# Patient Record
Sex: Male | Born: 1990 | Race: Black or African American | Hispanic: No | Marital: Single | Smoking: Never smoker
Health system: Southern US, Community
[De-identification: ages and names within clinical notes are randomized; demographics above are authoritative.]

## PROBLEM LIST (undated history)

## (undated) DIAGNOSIS — G4730 Sleep apnea, unspecified: Secondary | ICD-10-CM

## (undated) HISTORY — DX: Sleep apnea, unspecified: G47.30

---

## 2014-06-10 ENCOUNTER — Emergency Department (HOSPITAL_COMMUNITY)
Admission: EM | Admit: 2014-06-10 | Discharge: 2014-06-10 | Disposition: A | Discharge status: Home / Self Care | Payer: Medicaid Other | Attending: Emergency Medicine | Admitting: Emergency Medicine

## 2014-06-10 ENCOUNTER — Emergency Department (HOSPITAL_COMMUNITY): Payer: Medicaid Other

## 2014-06-10 ENCOUNTER — Encounter (HOSPITAL_COMMUNITY): Payer: Self-pay | Admitting: Emergency Medicine

## 2014-06-10 DIAGNOSIS — R42 Dizziness and giddiness: Secondary | ICD-10-CM | POA: Diagnosis not present

## 2014-06-10 DIAGNOSIS — R0602 Shortness of breath: Secondary | ICD-10-CM | POA: Diagnosis present

## 2014-06-10 DIAGNOSIS — R05 Cough: Secondary | ICD-10-CM | POA: Insufficient documentation

## 2014-06-10 DIAGNOSIS — R002 Palpitations: Secondary | ICD-10-CM | POA: Insufficient documentation

## 2014-06-10 LAB — CBC WITH DIFFERENTIAL/PLATELET
Basophils Absolute: 0 10*3/uL (ref 0.0–0.1)
Basophils Relative: 0 % (ref 0–1)
Eosinophils Absolute: 0 10*3/uL (ref 0.0–0.7)
Eosinophils Relative: 1 % (ref 0–5)
HEMATOCRIT: 40.6 % (ref 39.0–52.0)
Hemoglobin: 13.2 g/dL (ref 13.0–17.0)
LYMPHS ABS: 1.8 10*3/uL (ref 0.7–4.0)
LYMPHS PCT: 24 % (ref 12–46)
MCH: 27.4 pg (ref 26.0–34.0)
MCHC: 32.5 g/dL (ref 30.0–36.0)
MCV: 84.4 fL (ref 78.0–100.0)
Monocytes Absolute: 0.5 10*3/uL (ref 0.1–1.0)
Monocytes Relative: 7 % (ref 3–12)
NEUTROS ABS: 5.3 10*3/uL (ref 1.7–7.7)
Neutrophils Relative %: 68 % (ref 43–77)
PLATELETS: 236 10*3/uL (ref 150–400)
RBC: 4.81 MIL/uL (ref 4.22–5.81)
RDW: 13 % (ref 11.5–15.5)
WBC: 7.7 10*3/uL (ref 4.0–10.5)

## 2014-06-10 LAB — BASIC METABOLIC PANEL
ANION GAP: 13 (ref 5–15)
BUN: 10 mg/dL (ref 6–23)
CALCIUM: 9.4 mg/dL (ref 8.4–10.5)
CO2: 24 meq/L (ref 19–32)
Chloride: 102 mEq/L (ref 96–112)
Creatinine, Ser: 0.9 mg/dL (ref 0.50–1.35)
GFR calc Af Amer: >90 mL/min (ref >90)
GFR calc non Af Amer: >90 mL/min (ref >90)
Glucose, Bld: 90 mg/dL (ref 70–99)
Potassium: 4.3 mEq/L (ref 3.7–5.3)
SODIUM: 139 meq/L (ref 137–147)

## 2014-06-10 LAB — URINALYSIS, ROUTINE W REFLEX MICROSCOPIC
Bilirubin Urine: NEGATIVE
Glucose, UA: NEGATIVE mg/dL
Hgb urine dipstick: NEGATIVE
Ketones, ur: NEGATIVE mg/dL
LEUKOCYTES UA: NEGATIVE
NITRITE: NEGATIVE
PH: 6 (ref 5.0–8.0)
Protein, ur: NEGATIVE mg/dL
SPECIFIC GRAVITY, URINE: 1.025 (ref 1.005–1.030)
Urobilinogen, UA: 1 mg/dL (ref 0.0–1.0)

## 2014-06-10 MED ORDER — SODIUM CHLORIDE 0.9 % IV BOLUS (SEPSIS)
1000.0000 mL | Freq: Once | INTRAVENOUS | Status: AC
  Administered 2014-06-10: 1000 mL via INTRAVENOUS

## 2014-06-10 NOTE — ED Notes (Signed)
While taking the standing orthostatic blood pressure, pt's heart rate dropped to 38, and immediately increased to 68 beats per minute. Upstill, PA notified.

## 2014-06-10 NOTE — ED Notes (Signed)
Patient transported to X-ray 

## 2014-06-10 NOTE — ED Notes (Signed)
Pt c/o URI sx with cough and some SOB and palpitations

## 2014-06-10 NOTE — ED Provider Notes (Signed)
CSN: [id number]     Arrival date & time 06/10/14  1720 History   First MD Initiated Contact with Patient 06/10/14 1911     Chief Complaint  Patient presents with  . Cough  . Shortness of Breath  . Palpitations     (Consider location/radiation/quality/duration/timing/severity/associated sxs/prior Treatment) Patient is a 23 y.o. male presenting with shortness of breath and palpitations. The history is provided by the patient and the spouse. No language interpreter was used.  Shortness of Breath Associated symptoms: cough   Associated symptoms: no fever, no headaches and no rash   Associated symptoms comment:  He reports symptoms for the past 4 days of positional lightheadedness, "feel like I'm going to pass out", without syncope, associated with shortness of breath, heart palpitations and a mild cough. He has felt tired. He denies fever but has night sweats and nausea. No other illness in the house.  Palpitations Associated symptoms: cough and shortness of breath     History reviewed. No pertinent past medical history. History reviewed. No pertinent past surgical history. History reviewed. No pertinent family history. History  Substance Use Topics  . Smoking status: Never Smoker   . Smokeless tobacco: Not on file  . Alcohol Use: No    Review of Systems  Constitutional: Negative for fever and chills.  HENT: Negative.   Eyes: Negative.  Negative for photophobia and visual disturbance.  Respiratory: Positive for cough and shortness of breath.   Cardiovascular: Positive for palpitations.  Gastrointestinal: Negative.   Musculoskeletal: Negative.  Negative for myalgias.  Skin: Negative.  Negative for rash.  Neurological: Positive for light-headedness. Negative for syncope and headaches.      Allergies  Review of patient's allergies indicates no known allergies.  Home Medications   Prior to Admission medications   Medication Sig Start Date End Date Taking? Authorizing  Provider  acetaminophen (TYLENOL) 325 MG tablet Take 650 mg by mouth every 6 (six) hours as needed for mild pain or fever.   Yes Historical Provider, MD   BP 122/69 mmHg  Pulse 62  Temp(Src) 98.7 F (37.1 C) (Oral)  Resp 23  SpO2 100% Physical Exam  Constitutional: He is oriented to person, place, and time. He appears well-developed and well-nourished.  HENT:  Head: Normocephalic.  Eyes: Conjunctivae are normal.  Neck: Normal range of motion. Neck supple.  Cardiovascular: Normal rate and regular rhythm.   Pulmonary/Chest: Effort normal and breath sounds normal.  Abdominal: Soft. Bowel sounds are normal. There is no tenderness. There is no rebound and no guarding.  Musculoskeletal: Normal range of motion.  Neurological: He is alert and oriented to person, place, and time. Coordination normal.  Skin: Skin is warm and dry. No rash noted.  Psychiatric: He has a normal mood and affect.    ED Course  Procedures (including critical care time) Labs Review Labs Reviewed  CBC WITH DIFFERENTIAL  BASIC METABOLIC PANEL  URINALYSIS, ROUTINE W REFLEX MICROSCOPIC   Results for orders placed or performed during the hospital encounter of 06/10/14  Basic metabolic panel  Result Value Ref Range   Sodium 139 137 - 147 mEq/L   Potassium 4.3 3.7 - 5.3 mEq/L   Chloride 102 96 - 112 mEq/L   CO2 24 19 - 32 mEq/L   Glucose, Bld 90 70 - 99 mg/dL   BUN 10 6 - 23 mg/dL   Creatinine, Ser 4.090.90 0.50 - 1.35 mg/dL   Calcium 9.4 8.4 - 81.110.5 mg/dL   GFR calc non Af Amer >  90 >90 mL/min   GFR calc Af Amer >90 >90 mL/min   Anion gap 13 5 - 15  CBC with Differential  Result Value Ref Range   WBC 7.7 4.0 - 10.5 K/uL   RBC 4.81 4.22 - 5.81 MIL/uL   Hemoglobin 13.2 13.0 - 17.0 g/dL   HCT 16.140.6 09.639.0 - 04.552.0 %   MCV 84.4 78.0 - 100.0 fL   MCH 27.4 26.0 - 34.0 pg   MCHC 32.5 30.0 - 36.0 g/dL   RDW 40.913.0 81.111.5 - 91.415.5 %   Platelets 236 150 - 400 K/uL   Neutrophils Relative % 68 43 - 77 %   Neutro Abs 5.3 1.7 -  7.7 K/uL   Lymphocytes Relative 24 12 - 46 %   Lymphs Abs 1.8 0.7 - 4.0 K/uL   Monocytes Relative 7 3 - 12 %   Monocytes Absolute 0.5 0.1 - 1.0 K/uL   Eosinophils Relative 1 0 - 5 %   Eosinophils Absolute 0.0 0.0 - 0.7 K/uL   Basophils Relative 0 0 - 1 %   Basophils Absolute 0.0 0.0 - 0.1 K/uL  Urinalysis, Routine w reflex microscopic  Result Value Ref Range   Color, Urine YELLOW YELLOW   APPearance HAZY (A) CLEAR   Specific Gravity, Urine 1.025 1.005 - 1.030   pH 6.0 5.0 - 8.0   Glucose, UA NEGATIVE NEGATIVE mg/dL   Hgb urine dipstick NEGATIVE NEGATIVE   Bilirubin Urine NEGATIVE NEGATIVE   Ketones, ur NEGATIVE NEGATIVE mg/dL   Protein, ur NEGATIVE NEGATIVE mg/dL   Urobilinogen, UA 1.0 0.0 - 1.0 mg/dL   Nitrite NEGATIVE NEGATIVE   Leukocytes, UA NEGATIVE NEGATIVE   Dg Chest 2 View  06/10/2014   CLINICAL DATA:  Shortness of breath.  EXAM: CHEST  2 VIEW  COMPARISON:  None.  FINDINGS: The heart size and mediastinal contours are within normal limits. Both lungs are clear. No pneumothorax or pleural effusion is noted. The visualized skeletal structures are unremarkable.  IMPRESSION: No acute cardiopulmonary abnormality seen.   Electronically Signed   By: Roque LiasJames  Green M.D.   On: 06/10/2014 19:37    Imaging Review Dg Chest 2 View  06/10/2014   CLINICAL DATA:  Shortness of breath.  EXAM: CHEST  2 VIEW  COMPARISON:  None.  FINDINGS: The heart size and mediastinal contours are within normal limits. Both lungs are clear. No pneumothorax or pleural effusion is noted. The visualized skeletal structures are unremarkable.  IMPRESSION: No acute cardiopulmonary abnormality seen.   Electronically Signed   By: Roque LiasJames  Green M.D.   On: 06/10/2014 19:37     EKG Interpretation None      MDM   Final diagnoses:  SOB (shortness of breath)    1. Anxiety  Lab studies are normal. No orthostatic hypotension. He is feeling some better after IV fluids and appears better.   Symptomatic episodes last  approximately 10 minutes. He is under significant stress. Feel it is likely that symptoms are a result of anxiety and panic attacks. Discussed treatment and outpatient follow up.     Arnoldo HookerShari A Mistee Soliman, PA-C 06/11/14 0053  Layla MawKristen N Ward, DO 06/11/14 [id number]

## 2014-06-10 NOTE — ED Notes (Signed)
Upstill, PA at bedside.  

## 2014-06-10 NOTE — Discharge Instructions (Signed)

## 2015-01-16 ENCOUNTER — Encounter (HOSPITAL_COMMUNITY): Payer: Self-pay | Admitting: *Deleted

## 2015-01-16 ENCOUNTER — Emergency Department (HOSPITAL_COMMUNITY)
Admission: EM | Admit: 2015-01-16 | Discharge: 2015-01-16 | Disposition: A | Discharge status: Home / Self Care | Payer: Medicaid Other | Attending: Emergency Medicine | Admitting: Emergency Medicine

## 2015-01-16 DIAGNOSIS — W450XXA Nail entering through skin, initial encounter: Secondary | ICD-10-CM | POA: Insufficient documentation

## 2015-01-16 DIAGNOSIS — Y998 Other external cause status: Secondary | ICD-10-CM | POA: Diagnosis not present

## 2015-01-16 DIAGNOSIS — Y9389 Activity, other specified: Secondary | ICD-10-CM | POA: Diagnosis not present

## 2015-01-16 DIAGNOSIS — Z23 Encounter for immunization: Secondary | ICD-10-CM | POA: Insufficient documentation

## 2015-01-16 DIAGNOSIS — Y9289 Other specified places as the place of occurrence of the external cause: Secondary | ICD-10-CM | POA: Diagnosis not present

## 2015-01-16 DIAGNOSIS — T148XXA Other injury of unspecified body region, initial encounter: Secondary | ICD-10-CM

## 2015-01-16 DIAGNOSIS — S91332A Puncture wound without foreign body, left foot, initial encounter: Secondary | ICD-10-CM | POA: Diagnosis not present

## 2015-01-16 MED ORDER — TETANUS-DIPHTH-ACELL PERTUSSIS 5-2.5-18.5 LF-MCG/0.5 IM SUSP
0.5000 mL | Freq: Once | INTRAMUSCULAR | Status: AC
  Administered 2015-01-16: 0.5 mL via INTRAMUSCULAR
  Filled 2015-01-16: qty 0.5

## 2015-01-16 NOTE — ED Notes (Signed)
Pt reports he stepped on a nail on SAT. Pt reports he needs a tetanus shot.

## 2015-01-16 NOTE — ED Provider Notes (Signed)
CSN: [id number]     Arrival date & time 01/16/15  1756 History  This chart was scribed for Roxy Horsemanobert Michaelann Gunnoe, PA-C, working with Lorre NickAnthony Allen, MD by Elon SpannerGarrett Cook, ED Scribe. This patient was seen in room TR07C/TR07C and the patient's care was started at 6:10 PM.   Chief Complaint  Patient presents with  . Foot Injury   The history is provided by the patient. No language interpreter was used.   HPI Comments: Adrian Thomas is a 24 y.o. male who presents to the Emergency Department complaining of a slight puncture on the bottom of the left foot caused by a nail onset several days ago.  Patient reports the nail penetrated a very small amount and the entirety of the nail was visualized after the incident. He denies sensation of foreign body. Tetanus status unknown.   History reviewed. No pertinent past medical history. History reviewed. No pertinent past surgical history. History reviewed. No pertinent family history. History  Substance Use Topics  . Smoking status: Never Smoker   . Smokeless tobacco: Not on file  . Alcohol Use: No    Review of Systems  Constitutional: Negative for fever.  Skin: Positive for wound.      Allergies  Review of patient's allergies indicates no known allergies.  Home Medications   Prior to Admission medications   Medication Sig Start Date End Date Taking? Authorizing Provider  acetaminophen (TYLENOL) 325 MG tablet Take 650 mg by mouth every 6 (six) hours as needed for mild pain or fever.    Historical Provider, MD   There were no vitals taken for this visit. Physical Exam  Constitutional: He is oriented to person, place, and time. He appears well-developed and well-nourished. No distress.  HENT:  Head: Normocephalic and atraumatic.  Eyes: Conjunctivae and EOM are normal.  Neck: Neck supple. No tracheal deviation present.  Cardiovascular: Normal rate.   Pulmonary/Chest: Effort normal. No respiratory distress.  Musculoskeletal: Normal range of motion.   Neurological: He is alert and oriented to person, place, and time.  Skin: Skin is warm and dry.  Extremely shallow puncture of skin of right foot, not through all layers of skin, no retained foreign body, no sign of infection  Psychiatric: He has a normal mood and affect. His behavior is normal.  Nursing note and vitals reviewed.   ED Course  Procedures (including critical care time)  DIAGNOSTIC STUDIES: Oxygen Saturation is 97% on RA, normal by my interpretation.    COORDINATION OF CARE:  6:12 PM Will order Tdap.  Patient advised of return precautions.  Patient acknowledges and agrees with plan.    Labs Review Labs Reviewed - No data to display  Imaging Review No results found.   EKG Interpretation None      MDM   Final diagnoses:  Puncture wound    Patient stepped on a nail.  The nail does not appear to have broken all the layers of skin.  There is only a small very superficial nick of the skin.  There is no ttp.  No discharge, erythema, or signs of infection.  At this time, because of the extremely shallow nature of the wound, and the fact that it has nearly healed I will hold abx.  Patient was given clear precautions and signs to watch out for developing infection, in which case he should return to be placed on abx.    Tdap updated.  I personally performed the services described in this documentation, which was scribed in my presence.  The recorded information has been reviewed and is accurate.      Roxy Horseman, PA-C 01/16/15 1819  Lorre Nick, MD 01/16/15 [phone]

## 2015-01-16 NOTE — Discharge Instructions (Signed)
Puncture Wound °A puncture wound is an injury that extends through all layers of the skin and into the tissue beneath the skin (subcutaneous tissue). Puncture wounds become infected easily because germs often enter the body and go beneath the skin during the injury. Having a deep wound with a small entrance point makes it difficult for your caregiver to adequately clean the wound. This is especially true if you have stepped on a nail and it has passed through a dirty shoe or other situations where the wound is obviously contaminated. °CAUSES  °Many puncture wounds involve glass, nails, splinters, fish hooks, or other objects that enter the skin (foreign bodies). A puncture wound may also be caused by a human bite or animal bite. °DIAGNOSIS  °A puncture wound is usually diagnosed by your history and a physical exam. You may need to have an X-ray or an ultrasound to check for any foreign bodies still in the wound. °TREATMENT  °· Your caregiver will clean the wound as thoroughly as possible. Depending on the location of the wound, a bandage (dressing) may be applied. °· Your caregiver might prescribe antibiotic medicines. °· You may need a follow-up visit to check on your wound. Follow all instructions as directed by your caregiver. °HOME CARE INSTRUCTIONS  °· Change your dressing once per day, or as directed by your caregiver. If the dressing sticks, it may be removed by soaking the area in water. °· If your caregiver has given you follow-up instructions, it is very important that you return for a follow-up appointment. Not following up as directed could result in a chronic or permanent injury, pain, and disability. °· Only take over-the-counter or prescription medicines for pain, discomfort, or fever as directed by your caregiver. °· If you are given antibiotics, take them as directed. Finish them even if you start to feel better. °You may need a tetanus shot if: °· You cannot remember when you had your last tetanus  shot. °· You have never had a tetanus shot. °If you got a tetanus shot, your arm may swell, get red, and feel warm to the touch. This is common and not a problem. If you need a tetanus shot and you choose not to have one, there is a rare chance of getting tetanus. Sickness from tetanus can be serious. °You may need a rabies shot if an animal bite caused your puncture wound. °SEEK MEDICAL CARE IF:  °· You have redness, swelling, or increasing pain in the wound. °· You have red streaks going away from the wound. °· You notice a bad smell coming from the wound or dressing. °· You have yellowish-white fluid (pus) coming from the wound. °· You are treated with an antibiotic for infection, but the infection is not getting better. °· You notice something in the wound, such as rubber from your shoe, cloth, or another object. °· You have a fever. °· You have severe pain. °· You have difficulty breathing. °· You feel dizzy or faint. °· You cannot stop vomiting. °· You lose feeling, develop numbness, or cannot move a limb below the wound. °· Your symptoms worsen. °MAKE SURE YOU: °· Understand these instructions. °· Will watch your condition. °· Will get help right away if you are not doing well or get worse. °Document Released: 04/04/2005 Document Revised: 09/17/2011 Document Reviewed: 12/12/2010 °[employer]® Patient Information ©2015 [employer]. This information is not intended to replace advice given to you by your health care provider. Make sure you discuss any questions you   have with your health care provider. ° °

## 2015-01-16 NOTE — ED Notes (Signed)
Declined W/C at D/C and was escorted to lobby by RN. 

## 2020-02-22 ENCOUNTER — Other Ambulatory Visit: Payer: Self-pay

## 2020-02-22 ENCOUNTER — Encounter (HOSPITAL_COMMUNITY): Payer: Self-pay

## 2020-02-22 ENCOUNTER — Ambulatory Visit (HOSPITAL_COMMUNITY)
Admission: EM | Admit: 2020-02-22 | Discharge: 2020-02-22 | Disposition: A | Discharge status: Home / Self Care | Payer: HRSA Program | Attending: Family Medicine | Admitting: Family Medicine

## 2020-02-22 DIAGNOSIS — J40 Bronchitis, not specified as acute or chronic: Secondary | ICD-10-CM | POA: Diagnosis present

## 2020-02-22 DIAGNOSIS — R519 Headache, unspecified: Secondary | ICD-10-CM

## 2020-02-22 DIAGNOSIS — Z20822 Contact with and (suspected) exposure to covid-19: Secondary | ICD-10-CM | POA: Diagnosis present

## 2020-02-22 DIAGNOSIS — U071 COVID-19: Secondary | ICD-10-CM | POA: Diagnosis not present

## 2020-02-22 DIAGNOSIS — J029 Acute pharyngitis, unspecified: Secondary | ICD-10-CM

## 2020-02-22 DIAGNOSIS — R509 Fever, unspecified: Secondary | ICD-10-CM | POA: Diagnosis present

## 2020-02-22 DIAGNOSIS — R000 Tachycardia, unspecified: Secondary | ICD-10-CM | POA: Insufficient documentation

## 2020-02-22 DIAGNOSIS — R0981 Nasal congestion: Secondary | ICD-10-CM | POA: Insufficient documentation

## 2020-02-22 DIAGNOSIS — R5383 Other fatigue: Secondary | ICD-10-CM | POA: Diagnosis present

## 2020-02-22 DIAGNOSIS — Z1152 Encounter for screening for COVID-19: Secondary | ICD-10-CM

## 2020-02-22 DIAGNOSIS — R05 Cough: Secondary | ICD-10-CM

## 2020-02-22 LAB — POCT RAPID STREP A, ED / UC: Streptococcus, Group A Screen (Direct): NEGATIVE

## 2020-02-22 LAB — SARS CORONAVIRUS 2 (TAT 6-24 HRS): SARS Coronavirus 2: POSITIVE — AB

## 2020-02-22 MED ORDER — DEXAMETHASONE SODIUM PHOSPHATE 10 MG/ML IJ SOLN
INTRAMUSCULAR | Status: AC
  Filled 2020-02-22: qty 1

## 2020-02-22 MED ORDER — ACETAMINOPHEN 325 MG PO TABS
ORAL_TABLET | ORAL | Status: AC
  Filled 2020-02-22: qty 2

## 2020-02-22 MED ORDER — FLUTICASONE-SALMETEROL 100-50 MCG/DOSE IN AEPB: 1.0000 | INHALATION_SPRAY | Freq: Two times a day (BID) | RESPIRATORY_TRACT | 0 refills | Status: DC

## 2020-02-22 MED ORDER — ALBUTEROL SULFATE HFA 108 (90 BASE) MCG/ACT IN AERS: 2.0000 | INHALATION_SPRAY | RESPIRATORY_TRACT | 0 refills | Status: DC | PRN

## 2020-02-22 MED ORDER — DEXAMETHASONE SODIUM PHOSPHATE 10 MG/ML IJ SOLN
10.0000 mg | Freq: Once | INTRAMUSCULAR | Status: AC
  Administered 2020-02-22: 10 mg via INTRAMUSCULAR

## 2020-02-22 MED ORDER — PREDNISONE 10 MG (21) PO TBPK: ORAL_TABLET | Freq: Every day | ORAL | 0 refills | Status: DC

## 2020-02-22 MED ORDER — BENZONATATE 100 MG PO CAPS: 100.0000 mg | ORAL_CAPSULE | Freq: Three times a day (TID) | ORAL | 0 refills | Status: DC

## 2020-02-22 MED ORDER — ACETAMINOPHEN 325 MG PO TABS
650.0000 mg | ORAL_TABLET | Freq: Once | ORAL | Status: AC
  Administered 2020-02-22: 650 mg via ORAL

## 2020-02-22 NOTE — ED Triage Notes (Signed)
Pt presents for COVID testing after exposure. Reports headache, nasal congestion, cough and sore throat x 2 days.

## 2020-02-22 NOTE — Discharge Instructions (Signed)
I have sent in a steroid taper for you to take as well. Take 6 tablets for the first two days, take 5 tablets for day three and four, take 4 tablets for days five and six, take 3 tablets for days seven and eight, take 2 tablets for day nine and ten, then take 1 tablet for days eleven and twelve.  I have sent in an albuterol inhaler for you to use every 4 hours as needed for shortness of breath, cough  I have sent in a steroid inhaler for you to use twice a day to help keep your lungs open  I have sent in tessalon perles for cough as well  Your COVID test is pending.  You should self quarantine until the test result is back.    Take Tylenol as needed for fever or discomfort.  Rest and keep yourself hydrated.    Go to the emergency department if you develop acute worsening symptoms.

## 2020-02-22 NOTE — ED Provider Notes (Signed)
[facility]   [id number] 02/22/20 Arrival Time: 1012   CC: COVID symptoms  SUBJECTIVE: History from: patient.  Adrian Thomas is a 29 y.o. male who presents with abrupt onset of nasal congestion, PND, fever, fatigue, SOB and persistent dry cough for the last week. Reports sick exposure to positive COVID. Denies recent travel. Has negative history of Covid. Has not completed Covid vaccines. Has not taken OTC medications for this. There are no aggravating or alleviating factors. Denies previous symptoms in the past. Denies sinus pain, rhinorrhea, sore throat,  wheezing, chest pain, nausea, changes in bowel or bladder habits.    ROS: As per HPI.  All other pertinent ROS negative.     History reviewed. No pertinent past medical history. History reviewed. No pertinent surgical history. No Known Allergies No current facility-administered medications on file prior to encounter.   Current Outpatient Medications on File Prior to Encounter  Medication Sig Dispense Refill  . acetaminophen (TYLENOL) 325 MG tablet Take 650 mg by mouth every 6 (six) hours as needed for mild pain or fever.     Social History   Socioeconomic History  . Marital status: Single    Spouse name: Not on file  . Number of children: Not on file  . Years of education: Not on file  . Highest education level: Not on file  Occupational History  . Not on file  Tobacco Use  . Smoking status: Never Smoker  . Smokeless tobacco: Never Used  Substance and Sexual Activity  . Alcohol use: No  . Drug use: No  . Sexual activity: Not on file  Other Topics Concern  . Not on file  Social History Narrative  . Not on file   Social Determinants of Health   Financial Resource Strain:   . Difficulty of Paying Living Expenses:   Food Insecurity:   . Worried About Programme researcher, broadcasting/film/video in the Last Year:   . Barista in the Last Year:   Transportation Needs:   . Freight forwarder (Medical):   Marland Kitchen Lack of  Transportation (Non-Medical):   Physical Activity:   . Days of Exercise per Week:   . Minutes of Exercise per Session:   Stress:   . Feeling of Stress :   Social Connections:   . Frequency of Communication with Friends and Family:   . Frequency of Social Gatherings with Friends and Family:   . Attends Religious Services:   . Active Member of Clubs or Organizations:   . Attends Banker Meetings:   Marland Kitchen Marital Status:   Intimate Partner Violence:   . Fear of Current or Ex-Partner:   . Emotionally Abused:   Marland Kitchen Physically Abused:   . Sexually Abused:    History reviewed. No pertinent family history.  OBJECTIVE:  Vitals:   02/22/20 1232  BP: 121/73  Pulse: (!) 111  Resp: 18  Temp: (!) 101.4 F (38.6 C)  TempSrc: Oral  SpO2: 98%     General appearance: alert; appears fatigued, but nontoxic; speaking in full sentences and tolerating own secretions HEENT: NCAT; Ears: EACs clear, TMs pearly gray; Eyes: PERRL.  EOM grossly intact. Sinuses: nontender; Nose: nares patent without rhinorrhea, Throat: oropharynx clear, tonsils non erythematous or enlarged, uvula midline  Neck: supple without LAD Lungs: unlabored respirations, symmetrical air entry; cough: moderate; no respiratory distress; diffuse wheezing throughout bilateral lung fields Heart: regular rate and rhythm.  Radial pulses 2+ symmetrical bilaterally Skin: warm and dry Psychological: alert  and cooperative; normal mood and affect  LABS:  Results for orders placed or performed during the hospital encounter of 02/22/20 (from the past 24 hour(s))  POCT Rapid Strep A (ED/UC)     Status: None   Collection Time: 02/22/20 12:50 PM  Result Value Ref Range   Streptococcus, Group A Screen (Direct) NEGATIVE NEGATIVE     ASSESSMENT & PLAN:  1. Bronchitis   2. Nasal congestion   3. Other fatigue   4. Close exposure to COVID-19 virus   5. Encounter for screening for COVID-19   6. Fever, unspecified fever cause   7.  Tachycardia     Meds ordered this encounter  Medications  . acetaminophen (TYLENOL) tablet 650 mg  . dexamethasone (DECADRON) injection 10 mg  . predniSONE (STERAPRED UNI-PAK 21 TAB) 10 MG (21) TBPK tablet    Sig: Take by mouth daily. Take 6 tabs by mouth daily  for 2 days, then 5 tabs for 2 days, then 4 tabs for 2 days, then 3 tabs for 2 days, 2 tabs for 2 days, then 1 tab by mouth daily for 2 days    Dispense:  42 tablet    Refill:  0    Order Specific Question:   Supervising Provider    Answer:   Merrilee Jansky [id number]  . albuterol (VENTOLIN HFA) 108 (90 Base) MCG/ACT inhaler    Sig: Inhale 2 puffs into the lungs every 4 (four) hours as needed for wheezing or shortness of breath.    Dispense:  18 g    Refill:  0    Order Specific Question:   Supervising Provider    Answer:   Merrilee Jansky [id number]  . Fluticasone-Salmeterol (ADVAIR DISKUS) 100-50 MCG/DOSE AEPB    Sig: Inhale 1 puff into the lungs 2 (two) times daily.    Dispense:  60 each    Refill:  0    Order Specific Question:   Supervising Provider    Answer:   Merrilee Jansky [id number]  . benzonatate (TESSALON) 100 MG capsule    Sig: Take 1 capsule (100 mg total) by mouth every 8 (eight) hours.    Dispense:  21 capsule    Refill:  0    Order Specific Question:   Supervising Provider    Answer:   Merrilee Jansky [id number]    Decadron 10mg  IM in office today Tessalon Perles for cough Advair prescribed Albuterol inhaler prescribed Prednisone taper prescribed  COVID testing ordered.  It will take between 1-2 days for test results.  Someone will contact you regarding abnormal results.    Patient should remain in quarantine until they have received Covid results.  If negative you may resume normal activities (go back to work/school) while practicing hand hygiene, social distance, and mask wearing.  If positive, patient should remain in quarantine for 10 days from symptom onset AND greater than 72 hours after  symptoms resolution (absence of fever without the use of fever-reducing medication and improvement in respiratory symptoms), whichever is longer Get plenty of rest and push fluids Use OTC zyrtec for nasal congestion, runny nose, and/or sore throat Use OTC flonase for nasal congestion and runny nose Use medications daily for symptom relief Use OTC medications like ibuprofen or tylenol as needed fever or pain Call or go to the ED if you have any new or worsening symptoms such as fever, worsening cough, shortness of breath, chest tightness, chest pain, turning blue, changes in mental status.  Reviewed expectations  re: course of current medical issues. Questions answered. Outlined signs and symptoms indicating need for more acute intervention. Patient verbalized understanding. After Visit Summary given.         Moshe Cipro, NP 02/22/20 1304

## 2020-02-25 LAB — CULTURE, GROUP A STREP (THRC)

## 2020-02-26 ENCOUNTER — Other Ambulatory Visit: Payer: Self-pay

## 2020-02-26 ENCOUNTER — Emergency Department (HOSPITAL_COMMUNITY): Payer: HRSA Program

## 2020-02-26 ENCOUNTER — Inpatient Hospital Stay (HOSPITAL_COMMUNITY)
Admission: EM | Admit: 2020-02-26 | Discharge: 2020-03-03 | DRG: 177 | Disposition: A | Discharge status: Home / Self Care | Payer: HRSA Program | Attending: Family Medicine | Admitting: Family Medicine

## 2020-02-26 DIAGNOSIS — J9601 Acute respiratory failure with hypoxia: Secondary | ICD-10-CM | POA: Diagnosis present

## 2020-02-26 DIAGNOSIS — R404 Transient alteration of awareness: Secondary | ICD-10-CM | POA: Diagnosis not present

## 2020-02-26 DIAGNOSIS — U071 COVID-19: Secondary | ICD-10-CM | POA: Diagnosis not present

## 2020-02-26 DIAGNOSIS — R7401 Elevation of levels of liver transaminase levels: Secondary | ICD-10-CM | POA: Diagnosis present

## 2020-02-26 DIAGNOSIS — E860 Dehydration: Secondary | ICD-10-CM | POA: Diagnosis present

## 2020-02-26 DIAGNOSIS — R0902 Hypoxemia: Secondary | ICD-10-CM

## 2020-02-26 DIAGNOSIS — E871 Hypo-osmolality and hyponatremia: Secondary | ICD-10-CM | POA: Diagnosis present

## 2020-02-26 DIAGNOSIS — R569 Unspecified convulsions: Secondary | ICD-10-CM | POA: Diagnosis present

## 2020-02-26 DIAGNOSIS — J1282 Pneumonia due to coronavirus disease 2019: Secondary | ICD-10-CM | POA: Diagnosis present

## 2020-02-26 DIAGNOSIS — J9600 Acute respiratory failure, unspecified whether with hypoxia or hypercapnia: Secondary | ICD-10-CM | POA: Diagnosis present

## 2020-02-26 DIAGNOSIS — Z9981 Dependence on supplemental oxygen: Secondary | ICD-10-CM

## 2020-02-26 NOTE — ED Triage Notes (Addendum)
Pt was diagnosed with covid on the 16th. He has been having increased sob on exertion. Pt's sats were in the 80's upon arrival. Pt has SOB, Coughing, hot flashes and no appetite was placed on 2L

## 2020-02-27 ENCOUNTER — Emergency Department (HOSPITAL_COMMUNITY): Payer: HRSA Program

## 2020-02-27 ENCOUNTER — Inpatient Hospital Stay (HOSPITAL_COMMUNITY): Payer: HRSA Program

## 2020-02-27 ENCOUNTER — Encounter (HOSPITAL_COMMUNITY): Payer: Self-pay | Admitting: Internal Medicine

## 2020-02-27 DIAGNOSIS — J9601 Acute respiratory failure with hypoxia: Secondary | ICD-10-CM | POA: Diagnosis present

## 2020-02-27 DIAGNOSIS — R404 Transient alteration of awareness: Secondary | ICD-10-CM | POA: Diagnosis not present

## 2020-02-27 DIAGNOSIS — U071 COVID-19: Secondary | ICD-10-CM | POA: Diagnosis present

## 2020-02-27 DIAGNOSIS — E860 Dehydration: Secondary | ICD-10-CM | POA: Diagnosis present

## 2020-02-27 DIAGNOSIS — R7989 Other specified abnormal findings of blood chemistry: Secondary | ICD-10-CM | POA: Diagnosis not present

## 2020-02-27 DIAGNOSIS — R7401 Elevation of levels of liver transaminase levels: Secondary | ICD-10-CM | POA: Diagnosis present

## 2020-02-27 DIAGNOSIS — R569 Unspecified convulsions: Secondary | ICD-10-CM | POA: Diagnosis present

## 2020-02-27 DIAGNOSIS — Z9981 Dependence on supplemental oxygen: Secondary | ICD-10-CM | POA: Diagnosis not present

## 2020-02-27 DIAGNOSIS — J9600 Acute respiratory failure, unspecified whether with hypoxia or hypercapnia: Secondary | ICD-10-CM | POA: Diagnosis not present

## 2020-02-27 DIAGNOSIS — E871 Hypo-osmolality and hyponatremia: Secondary | ICD-10-CM | POA: Diagnosis present

## 2020-02-27 DIAGNOSIS — J1282 Pneumonia due to coronavirus disease 2019: Secondary | ICD-10-CM | POA: Diagnosis present

## 2020-02-27 LAB — CBC WITH DIFFERENTIAL/PLATELET
Abs Immature Granulocytes: 0.1 10*3/uL — ABNORMAL HIGH (ref 0.00–0.07)
Basophils Absolute: 0 10*3/uL (ref 0.0–0.1)
Basophils Relative: 0 %
Eosinophils Absolute: 0 10*3/uL (ref 0.0–0.5)
Eosinophils Relative: 0 %
HCT: 45.7 % (ref 39.0–52.0)
Hemoglobin: 15 g/dL (ref 13.0–17.0)
Immature Granulocytes: 1 %
Lymphocytes Relative: 8 %
Lymphs Abs: 1 10*3/uL (ref 0.7–4.0)
MCH: 27.9 pg (ref 26.0–34.0)
MCHC: 32.8 g/dL (ref 30.0–36.0)
MCV: 84.9 fL (ref 80.0–100.0)
Monocytes Absolute: 0.6 10*3/uL (ref 0.1–1.0)
Monocytes Relative: 5 %
Neutro Abs: 11.1 10*3/uL — ABNORMAL HIGH (ref 1.7–7.7)
Neutrophils Relative %: 86 %
Platelets: 222 10*3/uL (ref 150–400)
RBC: 5.38 MIL/uL (ref 4.22–5.81)
RDW: 14.2 % (ref 11.5–15.5)
WBC: 12.7 10*3/uL — ABNORMAL HIGH (ref 4.0–10.5)
nRBC: 0 % (ref 0.0–0.2)

## 2020-02-27 LAB — COMPREHENSIVE METABOLIC PANEL
ALT: 32 U/L (ref 0–44)
AST: 45 U/L — ABNORMAL HIGH (ref 15–41)
Albumin: 3.2 g/dL — ABNORMAL LOW (ref 3.5–5.0)
Alkaline Phosphatase: 37 U/L — ABNORMAL LOW (ref 38–126)
Anion gap: 12 (ref 5–15)
BUN: 21 mg/dL — ABNORMAL HIGH (ref 6–20)
CO2: 23 mmol/L (ref 22–32)
Calcium: 8.5 mg/dL — ABNORMAL LOW (ref 8.9–10.3)
Chloride: 95 mmol/L — ABNORMAL LOW (ref 98–111)
Creatinine, Ser: 1.05 mg/dL (ref 0.61–1.24)
GFR calc Af Amer: >60 mL/min (ref >60)
GFR calc non Af Amer: >60 mL/min (ref >60)
Glucose, Bld: 126 mg/dL — ABNORMAL HIGH (ref 70–99)
Potassium: 4.8 mmol/L (ref 3.5–5.1)
Sodium: 130 mmol/L — ABNORMAL LOW (ref 135–145)
Total Bilirubin: 0.8 mg/dL (ref 0.3–1.2)
Total Protein: 8.3 g/dL — ABNORMAL HIGH (ref 6.5–8.1)

## 2020-02-27 LAB — C-REACTIVE PROTEIN: CRP: 18.9 mg/dL — ABNORMAL HIGH (ref <1.0)

## 2020-02-27 LAB — TROPONIN I (HIGH SENSITIVITY)
Troponin I (High Sensitivity): 54 ng/L — ABNORMAL HIGH (ref <18)
Troponin I (High Sensitivity): 5 ng/L (ref <18)

## 2020-02-27 LAB — D-DIMER, QUANTITATIVE: D-Dimer, Quant: 2.14 ug/mL-FEU — ABNORMAL HIGH (ref 0.00–0.50)

## 2020-02-27 LAB — ABO/RH: ABO/RH(D): A POS

## 2020-02-27 LAB — PROCALCITONIN: Procalcitonin: 0.35 ng/mL

## 2020-02-27 LAB — HIV ANTIBODY (ROUTINE TESTING W REFLEX): HIV Screen 4th Generation wRfx: NONREACTIVE

## 2020-02-27 MED ORDER — LACTATED RINGERS IV BOLUS
1000.0000 mL | Freq: Once | INTRAVENOUS | Status: AC
Start: 2020-02-27 — End: 2020-02-27
  Administered 2020-02-27: 1000 mL via INTRAVENOUS

## 2020-02-27 MED ORDER — ASCORBIC ACID 500 MG PO TABS
500.0000 mg | ORAL_TABLET | Freq: Every day | ORAL | Status: DC
  Administered 2020-02-27 – 2020-03-03 (×6): 500 mg via ORAL
  Filled 2020-02-27 (×6): qty 1

## 2020-02-27 MED ORDER — ONDANSETRON HCL 4 MG/2ML IJ SOLN: 4.0000 mg | Freq: Four times a day (QID) | INTRAMUSCULAR | Status: DC | PRN

## 2020-02-27 MED ORDER — ACETAMINOPHEN 500 MG PO TABS
1000.0000 mg | ORAL_TABLET | Freq: Once | ORAL | Status: AC
  Administered 2020-02-27: 1000 mg via ORAL
  Filled 2020-02-27: qty 2

## 2020-02-27 MED ORDER — SODIUM CHLORIDE 0.9 % IV SOLN
200.0000 mg | Freq: Once | INTRAVENOUS | Status: AC
  Administered 2020-02-27: 200 mg via INTRAVENOUS
  Filled 2020-02-27: qty 40

## 2020-02-27 MED ORDER — ONDANSETRON HCL 4 MG PO TABS: 4.0000 mg | ORAL_TABLET | Freq: Four times a day (QID) | ORAL | Status: DC | PRN

## 2020-02-27 MED ORDER — ZINC SULFATE 220 (50 ZN) MG PO CAPS
220.0000 mg | ORAL_CAPSULE | Freq: Every day | ORAL | Status: DC
  Administered 2020-02-27 – 2020-03-03 (×6): 220 mg via ORAL
  Filled 2020-02-27 (×6): qty 1

## 2020-02-27 MED ORDER — SODIUM CHLORIDE 0.9 % IV SOLN
100.0000 mg | Freq: Every day | INTRAVENOUS | Status: AC
  Administered 2020-02-28 – 2020-03-02 (×4): 100 mg via INTRAVENOUS
  Filled 2020-02-27 (×4): qty 20

## 2020-02-27 MED ORDER — ENOXAPARIN SODIUM 40 MG/0.4ML ~~LOC~~ SOLN
40.0000 mg | Freq: Every day | SUBCUTANEOUS | Status: DC
  Administered 2020-02-27 – 2020-02-29 (×3): 40 mg via SUBCUTANEOUS
  Filled 2020-02-27 (×3): qty 0.4

## 2020-02-27 MED ORDER — GUAIFENESIN-DM 100-10 MG/5ML PO SYRP: 10.0000 mL | ORAL_SOLUTION | ORAL | Status: DC | PRN

## 2020-02-27 MED ORDER — HYDROCOD POLST-CPM POLST ER 10-8 MG/5ML PO SUER: 5.0000 mL | Freq: Two times a day (BID) | ORAL | Status: DC | PRN

## 2020-02-27 MED ORDER — METHYLPREDNISOLONE SODIUM SUCC 125 MG IJ SOLR
0.5000 mg/kg | Freq: Two times a day (BID) | INTRAMUSCULAR | Status: DC
  Administered 2020-02-27 – 2020-03-03 (×11): 47.5 mg via INTRAVENOUS
  Filled 2020-02-27 (×11): qty 2

## 2020-02-27 NOTE — H&P (Signed)
History and Physical    Adrian Thomas ZOX:[id number] DOB: October 28, 1990 DOA: 02/26/2020  PCP: Patient, No Pcp Per  Patient coming from: Home.  Chief Complaint: Shortness of breath.  HPI: Adrian Thomas is a 29 y.o. male with no significant past medical history presents to the ER because of worsening shortness of breath and nonproductive cough.  Patient started having symptoms of headache and upper respiratory tract symptoms on August 14.  Other family members also have similar symptoms.  On August 16 patient was diagnosed with COVID-19 infection and was taking prednisone and symptomatically managing.  Since then patient has become more short of breath with exertion and having recurrent cough.  Due to the worsening shortness of breath patient presents to the ER.  ED Course: In the ER patient had a fever of 100.4 F and was hypoxic requiring 3 L oxygen.  Chest x-ray shows bilateral infiltrates.  Patient admitted for further management of acute respiratory failure with hypoxia secondary to COVID-19 infection.  Started on IV remdesivir and IV Solu-Medrol.  Labs are significant for albumin of 3.2 AST of 45.  Sodium 130.  WBC 12.7.  Review of Systems: As per HPI, rest all negative.   History reviewed. No pertinent past medical history.  History reviewed. No pertinent surgical history.   reports that he has never smoked. He has never used smokeless tobacco. He reports that he does not drink alcohol and does not use drugs.  No Known Allergies  Family History  Problem Relation Age of Onset  . Diabetes Mellitus II Neg Hx     Prior to Admission medications   Medication Sig Start Date End Date Taking? Authorizing Provider  acetaminophen (TYLENOL) 325 MG tablet Take 650 mg by mouth every 6 (six) hours as needed for mild pain or fever.   Yes [provider]  albuterol (VENTOLIN HFA) 108 (90 Base) MCG/ACT inhaler Inhale 2 puffs into the lungs every 4 (four) hours as needed for wheezing or  shortness of breath. 02/22/20  Yes Moshe Cipro, NP  benzonatate (TESSALON) 100 MG capsule Take 1 capsule (100 mg total) by mouth every 8 (eight) hours. 02/22/20  Yes Moshe Cipro, NP  Fluticasone-Salmeterol (ADVAIR DISKUS) 100-50 MCG/DOSE AEPB Inhale 1 puff into the lungs 2 (two) times daily. 02/22/20  Yes Moshe Cipro, NP  predniSONE (STERAPRED UNI-PAK 21 TAB) 10 MG (21) TBPK tablet Take by mouth daily. Take 6 tabs by mouth daily  for 2 days, then 5 tabs for 2 days, then 4 tabs for 2 days, then 3 tabs for 2 days, 2 tabs for 2 days, then 1 tab by mouth daily for 2 days Patient taking differently: Take 10-60 mg by mouth See admin instructions. Take 6 tabs by mouth daily  for 2 days, then 5 tabs for 2 days, then 4 tabs for 2 days, then 3 tabs for 2 days, 2 tabs for 2 days, then 1 tab by mouth daily for 2 days 02/22/20  Yes Moshe Cipro, NP    Physical Exam: Constitutional: Moderately built and nourished. Vitals:   02/27/20 0140 02/27/20 0144 02/27/20 0215 02/27/20 0245  BP:   128/80 120/82  Pulse:   95 95  Resp:   (!) 36 (!) 37  Temp: 98.7 F (37.1 C)     TempSrc: Oral     SpO2:   98% 99%  Weight:  95.3 kg    Height:  5\' 11"  (1.803 m)     Eyes: Anicteric no pallor. ENMT: No discharge from  the ears eyes nose or mouth. Neck: No mass felt.  No neck rigidity. Respiratory: No rhonchi or crepitations. Cardiovascular: S1-S2 heard. Abdomen: Soft nontender bowel sounds present. Musculoskeletal: No edema. Skin: No rash. Neurologic: Alert awake oriented to time place and person.  Moves all extremities 5 x 5. Psychiatric: Appears normal.  Normal affect.   Labs on Admission: I have personally reviewed following labs and imaging studies  CBC: Recent Labs  Lab 02/27/20 0017  WBC 12.7*  NEUTROABS 11.1*  HGB 15.0  HCT 45.7  MCV 84.9  PLT 222   Basic Metabolic Panel: Recent Labs  Lab 02/27/20 0017  NA 130*  K 4.8  CL 95*  CO2 23  GLUCOSE 126*  BUN 21*   CREATININE 1.05  CALCIUM 8.5*   GFR: Estimated Creatinine Clearance: 120.1 mL/min (by C-G formula based on SCr of 1.05 mg/dL). Liver Function Tests: Recent Labs  Lab 02/27/20 0017  AST 45*  ALT 32  ALKPHOS 37*  BILITOT 0.8  PROT 8.3*  ALBUMIN 3.2*   No results for input(s): LIPASE, AMYLASE in the last 168 hours. No results for input(s): AMMONIA in the last 168 hours. Coagulation Profile: No results for input(s): INR, PROTIME in the last 168 hours. Cardiac Enzymes: No results for input(s): CKTOTAL, CKMB, CKMBINDEX, TROPONINI in the last 168 hours. BNP (last 3 results) No results for input(s): PROBNP in the last 8760 hours. HbA1C: No results for input(s): HGBA1C in the last 72 hours. CBG: No results for input(s): GLUCAP in the last 168 hours. Lipid Profile: No results for input(s): CHOL, HDL, LDLCALC, TRIG, CHOLHDL, LDLDIRECT in the last 72 hours. Thyroid Function Tests: No results for input(s): TSH, T4TOTAL, FREET4, T3FREE, THYROIDAB in the last 72 hours. Anemia Panel: No results for input(s): VITAMINB12, FOLATE, FERRITIN, TIBC, IRON, RETICCTPCT in the last 72 hours. Urine analysis:    Component Value Date/Time   COLORURINE YELLOW 06/10/2014 2032   APPEARANCEUR HAZY (A) 06/10/2014 2032   LABSPEC 1.025 06/10/2014 2032   PHURINE 6.0 06/10/2014 2032   GLUCOSEU NEGATIVE 06/10/2014 2032   HGBUR NEGATIVE 06/10/2014 2032   BILIRUBINUR NEGATIVE 06/10/2014 2032   KETONESUR NEGATIVE 06/10/2014 2032   PROTEINUR NEGATIVE 06/10/2014 2032   UROBILINOGEN 1.0 06/10/2014 2032   NITRITE NEGATIVE 06/10/2014 2032   LEUKOCYTESUR NEGATIVE 06/10/2014 2032   Sepsis Labs: @LABRCNTIP (procalcitonin:4,lacticidven:4) ) Recent Results (from the past 240 hour(s))  SARS CORONAVIRUS 2 (TAT 6-24 HRS) Nasopharyngeal Nasopharyngeal Swab     Status: Abnormal   Collection Time: 02/22/20 12:32 PM   Specimen: Nasopharyngeal Swab  Result Value Ref Range Status   SARS Coronavirus 2 POSITIVE (A)  NEGATIVE Final    Comment: EMAILED 02/24/20 1935 02/22/2020 WBOND (NOTE) SARS-CoV-2 target nucleic acids are DETECTED.  The SARS-CoV-2 RNA is generally detectable in upper and lower respiratory specimens during the acute phase of infection. Positive results are indicative of the presence of SARS-CoV-2 RNA. Clinical correlation with patient history and other diagnostic information is  necessary to determine patient infection status. Positive results do not rule out bacterial infection or co-infection with other viruses.  The expected result is Negative.  Fact Sheet for Patients: 02/24/2020  Fact Sheet for Healthcare Providers: HairSlick.no  This test is not yet approved or cleared by the quierodirigir.com FDA and  has been authorized for detection and/or diagnosis of SARS-CoV-2 by FDA under an Emergency Use Authorization (EUA). This EUA will remain  in effect (meaning this test can be used) for the duration of the COVID-19 declarat  ion under Section 564(b)(1) of the Act, 21 U.S.C. section 360bbb-3(b)(1), unless the authorization is terminated or revoked sooner.   Performed at [facility], [address], [place], [place] [address]   Culture, group A strep (throat)     Status: None   Collection Time: 02/22/20  1:33 PM   Specimen: Throat  Result Value Ref Range Status   Specimen Description THROAT  Final   Special Requests NONE  Final   Culture   Final    NO GROUP A STREP (S.PYOGENES) ISOLATED Performed at [facility], [address], [place], [place] [address]    Report Status 02/25/2020 FINAL  Final     Radiological Exams on Admission: DG Chest Portable 1 View  Result Date: 02/27/2020 CLINICAL DATA:  Cough and sore throat EXAM: PORTABLE CHEST 1 VIEW COMPARISON:  06/10/2014 FINDINGS: Shallow lung inflation with bibasilar opacities. No pleural effusion or pneumothorax. Normal cardiomediastinal  contours. IMPRESSION: Shallow lung inflation with bibasilar opacities, possibly atelectasis or infection. Electronically Signed   By: Deatra Robinson M.D.   On: 02/27/2020 00:58    EKG: Independently reviewed.  Sinus tachycardia.  Assessment/Plan Principal Problem:   Acute respiratory failure due to COVID-19 [facility]) Active Problems:   Acute respiratory disease due to COVID-19 virus    1. Acute respiratory failure with hypoxia secondary to COVID-19 infection for which I have started patient on IV Solu-Medrol and remdesivir.  I have also discussed with patient about use of Bircatinib if patient gets more short of breath.  Patient agrees to get it. 2. Hyponatremia could be from dehydration.  Follow metabolic panel. 3. Mildly elevated AST follow LFTs.  Since patient is acute respiratory failure with hypoxia secondary to COVID-19 infection patient will need inpatient status.   DVT prophylaxis: Lovenox. Code Status: Full code. Family Communication: Discussed with patient. Disposition Plan: Home. Consults called: None. Admission status: Inpatient.   Eduard Clos MD [facility] Pager [phone].  If 7PM-7AM, please contact night-coverage [email or web address] Password [email or web address]  02/27/2020, 3:12 AM

## 2020-02-27 NOTE — ED Notes (Signed)
Received bedside report from [facility]. Pt noted to be resting comfortably at this time. Call bell in reach, stretcher low and locked with side rails up x2.

## 2020-02-27 NOTE — ED Notes (Signed)
Admitting paged to RN per her request 

## 2020-02-27 NOTE — ED Notes (Signed)
This RN noted pt O2 sat to be 85% on 5L [place] via cardiac monitor. This RN went to round on pt to assess and noted pt to be hanging off of the bed with sonorous breaths with empty stare to ceiling. Pt appeared to have seizure like activity and had mild tremoring to entire body. Pt did not respond initially when RN attempted to talk to pt; pt assisted back on bed with [place] RN and pt stated "I don't know what happened. I feel really sleepy." Pt unsure what happened. States "I feel really hot".

## 2020-02-27 NOTE — ED Provider Notes (Signed)
[facility] EMERGENCY DEPARTMENT Provider Note   CSN: [id number] Arrival date & time: 02/26/20  2320     History Chief Complaint  Patient presents with   Covid Exposure    covid +    Adrian Thomas is a 29 y.o. male.   Shortness of Breath Severity:  Moderate Onset quality:  Gradual Duration:  2 days Timing:  Constant Progression:  Worsening Chronicity:  New Context: not animal exposure, not emotional upset and not strong odors   Relieved by:  None tried Worsened by:  Nothing Ineffective treatments:  None tried Associated symptoms: no abdominal pain, no fever, no headaches and no sore throat   Risk factors: no recent alcohol use        History reviewed. No pertinent past medical history.  Patient Active Problem List   Diagnosis Date Noted   Acute respiratory failure due to COVID-19 [facility]) 02/27/2020   Acute respiratory disease due to COVID-19 virus 02/27/2020    History reviewed. No pertinent surgical history.     Family History  Problem Relation Age of Onset   Diabetes Mellitus II Neg Hx     Social History   Tobacco Use   Smoking status: Never Smoker   Smokeless tobacco: Never Used  Substance Use Topics   Alcohol use: No   Drug use: No    Home Medications Prior to Admission medications   Medication Sig Start Date End Date Taking? Authorizing Provider  acetaminophen (TYLENOL) 325 MG tablet Take 650 mg by mouth every 6 (six) hours as needed for mild pain or fever.   Yes [provider]  albuterol (VENTOLIN HFA) 108 (90 Base) MCG/ACT inhaler Inhale 2 puffs into the lungs every 4 (four) hours as needed for wheezing or shortness of breath. 02/22/20  Yes Moshe Cipro, NP  benzonatate (TESSALON) 100 MG capsule Take 1 capsule (100 mg total) by mouth every 8 (eight) hours. 02/22/20  Yes Moshe Cipro, NP  Fluticasone-Salmeterol (ADVAIR DISKUS) 100-50 MCG/DOSE AEPB Inhale 1 puff into the lungs 2 (two) times daily.  02/22/20  Yes Moshe Cipro, NP  predniSONE (STERAPRED UNI-PAK 21 TAB) 10 MG (21) TBPK tablet Take by mouth daily. Take 6 tabs by mouth daily  for 2 days, then 5 tabs for 2 days, then 4 tabs for 2 days, then 3 tabs for 2 days, 2 tabs for 2 days, then 1 tab by mouth daily for 2 days Patient taking differently: Take 10-60 mg by mouth See admin instructions. Take 6 tabs by mouth daily  for 2 days, then 5 tabs for 2 days, then 4 tabs for 2 days, then 3 tabs for 2 days, 2 tabs for 2 days, then 1 tab by mouth daily for 2 days 02/22/20  Yes Moshe Cipro, NP    Allergies    Patient has no known allergies.  Review of Systems   Review of Systems  Constitutional: Negative for fever.  HENT: Negative for sore throat.   Respiratory: Positive for shortness of breath.   Gastrointestinal: Negative for abdominal pain.  Neurological: Negative for headaches.  All other systems reviewed and are negative.   Physical Exam Updated Vital Signs BP 129/65    Pulse 77    Temp 98.7 F (37.1 C) (Oral)    Resp (!) 22    Ht 5\' 11"  (1.803 m)    Wt 95.3 kg    SpO2 98%    BMI 29.29 kg/m   Physical Exam Vitals and nursing note reviewed.  Constitutional:  Appearance: He is well-developed.  HENT:     Head: Normocephalic and atraumatic.     Nose: Nose normal. No congestion or rhinorrhea.     Mouth/Throat:     Mouth: Mucous membranes are moist.     Pharynx: Oropharynx is clear.  Eyes:     Pupils: Pupils are equal, round, and reactive to light.  Cardiovascular:     Rate and Rhythm: Normal rate.  Pulmonary:     Effort: Tachypnea and respiratory distress present.     Breath sounds: Decreased air movement present. Wheezing present.  Abdominal:     General: Abdomen is flat. There is no distension.  Musculoskeletal:        General: Normal range of motion.     Cervical back: Normal range of motion.  Skin:    General: Skin is warm and dry.     Findings: No bruising.  Neurological:     General: No  focal deficit present.     Mental Status: He is alert.     Cranial Nerves: No cranial nerve deficit.     ED Results / Procedures / Treatments   Labs (all labs ordered are listed, but only abnormal results are displayed) Labs Reviewed  CBC WITH DIFFERENTIAL/PLATELET - Abnormal; Notable for the following components:      Result Value   WBC 12.7 (*)    Neutro Abs 11.1 (*)    Abs Immature Granulocytes 0.10 (*)    All other components within normal limits  COMPREHENSIVE METABOLIC PANEL - Abnormal; Notable for the following components:   Sodium 130 (*)    Chloride 95 (*)    Glucose, Bld 126 (*)    BUN 21 (*)    Calcium 8.5 (*)    Total Protein 8.3 (*)    Albumin 3.2 (*)    AST 45 (*)    Alkaline Phosphatase 37 (*)    All other components within normal limits  C-REACTIVE PROTEIN - Abnormal; Notable for the following components:   CRP 18.9 (*)    All other components within normal limits  D-DIMER, QUANTITATIVE (NOT AT [facility]) - Abnormal; Notable for the following components:   D-Dimer, Quant 2.14 (*)    All other components within normal limits  TROPONIN I (HIGH SENSITIVITY) - Abnormal; Notable for the following components:   Troponin I (High Sensitivity) 54 (*)    All other components within normal limits  PROCALCITONIN  HIV ANTIBODY (ROUTINE TESTING W REFLEX)  ABO/RH  TROPONIN I (HIGH SENSITIVITY)    EKG EKG Interpretation  Date/Time:  Saturday February 27 2020 00:25:45 EDT Ventricular Rate:  101 PR Interval:    QRS Duration: 88 QT Interval:  336 QTC Calculation: 436 R Axis:   56 Text Interpretation: Sinus tachycardia Right atrial enlargement aside from tachycardia, no obvious changes Confirmed by Marily Memos [phone]) on 02/27/2020 12:52:03 AM   Radiology DG Chest Portable 1 View  Result Date: 02/27/2020 CLINICAL DATA:  Cough and sore throat EXAM: PORTABLE CHEST 1 VIEW COMPARISON:  06/10/2014 FINDINGS: Shallow lung inflation with bibasilar opacities. No pleural  effusion or pneumothorax. Normal cardiomediastinal contours. IMPRESSION: Shallow lung inflation with bibasilar opacities, possibly atelectasis or infection. Electronically Signed   By: Deatra Robinson M.D.   On: 02/27/2020 00:58    Procedures .Critical Care Performed by: Marily Memos, MD Authorized by: Marily Memos, MD   Critical care provider statement:    Critical care time (minutes):  45   Critical care was necessary to treat or prevent  imminent or life-threatening deterioration of the following conditions:  Respiratory failure   Critical care was time spent personally by me on the following activities:  Discussions with consultants, evaluation of patient's response to treatment, examination of patient, ordering and performing treatments and interventions, ordering and review of laboratory studies, ordering and review of radiographic studies, pulse oximetry, re-evaluation of patient's condition, obtaining history from patient or surrogate and review of old charts   (including critical care time)  Medications Ordered in ED Medications  ondansetron (ZOFRAN) tablet 4 mg (has no administration in time range)    Or  ondansetron (ZOFRAN) injection 4 mg (has no administration in time range)  enoxaparin (LOVENOX) injection 40 mg (40 mg Subcutaneous Given 02/27/20 0531)  remdesivir 200 mg in sodium chloride 0.9% 250 mL IVPB (0 mg Intravenous Stopped 02/27/20 0618)    Followed by  remdesivir 100 mg in sodium chloride 0.9 % 100 mL IVPB (has no administration in time range)  methylPREDNISolone sodium succinate (SOLU-MEDROL) 125 mg/2 mL injection 47.5 mg (47.5 mg Intravenous Given 02/27/20 0533)  guaiFENesin-dextromethorphan (ROBITUSSIN DM) 100-10 MG/5ML syrup 10 mL (has no administration in time range)  chlorpheniramine-HYDROcodone (TUSSIONEX) 10-8 MG/5ML suspension 5 mL (has no administration in time range)  ascorbic acid (VITAMIN C) tablet 500 mg (has no administration in time range)  zinc sulfate  capsule 220 mg (has no administration in time range)  lactated ringers bolus 1,000 mL (0 mLs Intravenous Stopped 02/27/20 0313)  acetaminophen (TYLENOL) tablet 1,000 mg (1,000 mg Oral Given 02/27/20 0142)    ED Course  I have reviewed the triage vital signs and the nursing notes.  Pertinent labs & imaging results that were available during my care of the patient were reviewed by me and considered in my medical decision making (see chart for details).    MDM Rules/Calculators/A&P                          covid positive with hypoxic respiratory failure. Doing well on 3L [place]. xr ok. Admit.   Final Clinical Impression(s) / ED Diagnoses Final diagnoses:  Hypoxia  COVID-19    Rx / DC Orders ED Discharge Orders    None       Aaralyn Kil, Barbara Cower, MD 02/27/20 [phone]

## 2020-02-27 NOTE — ED Notes (Signed)
RN educated pt on use of incentive spirometer and flutter valve. Pt showed return demonstration.

## 2020-02-27 NOTE — ED Notes (Signed)
Pt given rags, basin with soap and water to bathe body. Urinal emptied. Pt aware to use call bell when complete for VS.

## 2020-02-27 NOTE — TOC Initial Note (Signed)
Transition of Care [facility]) - Initial/Assessment Note    Patient Details  Name: Adrian Thomas MRN: [id number] Date of Birth: 1990-10-25  Transition of Care [facility] CM/SW Contact:    Lockie Pares, RN Phone Number: 02/27/2020, 5:46 PM  Clinical Narrative:                 Admitted for COVID pneumonia symptoms started 1 week ago . Others sick in the family. Worsening SHOB so presented to ED. On 3LPM  patient is uninsured will need Financial Counseling and other resources.  CM will follow for needs  Expected Discharge Plan: Home/Self Care Barriers to Discharge: Inadequate or no insurance, Continued Medical Work up   Patient Goals and CMS Choice        Expected Discharge Plan and Services Expected Discharge Plan: Home/Self Care       Living arrangements for the past 2 months: Single Family Home                                      Prior Living Arrangements/Services Living arrangements for the past 2 months: Single Family Home Lives with:: Significant Other Patient language and need for interpreter reviewed:: Yes Do you feel safe going back to the place where you live?: Yes      Need for Family Participation in Patient Care: Yes (Comment) Care giver support system in place?: Yes (comment)   Criminal Activity/Legal Involvement Pertinent to Current Situation/Hospitalization: No - Comment as needed  Activities of Daily Living      Permission Sought/Granted                  Emotional Assessment       Orientation: : Oriented to Self, Oriented to Place, Oriented to  Time Alcohol / Substance Use: Not Applicable Psych Involvement: No (comment)  Admission diagnosis:  Acute respiratory failure due to COVID-19 (HCC) [U07.1, J96.00] Acute respiratory disease due to COVID-19 virus [U07.1, J06.9] Patient Active Problem List   Diagnosis Date Noted  . Acute respiratory failure due to COVID-19 (HCC) 02/27/2020  . Acute respiratory disease due to COVID-19 virus  02/27/2020   PCP:  Patient, No Pcp Per Pharmacy:   [id number] [place], [place] - [address] [place] [address] Phone: [phone] Fax: [phone]     Social Determinants of Health (SDOH) Interventions    Readmission Risk Interventions No flowsheet data found.

## 2020-02-27 NOTE — ED Notes (Signed)
Pt transported to MRI 

## 2020-02-27 NOTE — ED Notes (Signed)
Provided pt with fresh ice water 

## 2020-02-27 NOTE — ED Notes (Signed)
Provided pt w/breakfast tray. 

## 2020-02-27 NOTE — ED Notes (Signed)
Dinner tray provided

## 2020-02-27 NOTE — ED Notes (Signed)
Updated provider on pt status. Awaiting orders.

## 2020-02-27 NOTE — Progress Notes (Signed)
Called by RN.  Found to be unresponsive with shallow breathing and seizure-like activity that was witnessed by RN Patient is now awake but groggy and does not remember what happened.  No history of seizure activity. Obtain CT of the head stat. Ordered MRI of brain and EEG to further evaluate. If has recurrent seizure activity will start Keppra

## 2020-02-27 NOTE — Progress Notes (Signed)
PROGRESS NOTE    Adrian Thomas  IRS:[id number] DOB: 07-16-1990 DOA: 02/26/2020 PCP: Patient, No Pcp Per    Brief Narrative:  29 year old male with no significant history presented to the ER with shortness of breath and nonproductive cough.  Initial symptoms started on 8/14.  Diagnosed with COVID-19 infection on 8/16.  Trying to manage at home but came to ER with worsening shortness of breath. In the emergency room, temperature 100.4, requiring 3 L of oxygen.  Chest x-ray with bilateral infiltrates.  Given IV remdesivir and IV Solu-Medrol.   Assessment & Plan:   Principal Problem:   Acute respiratory failure due to COVID-19 [facility]) Active Problems:   Acute respiratory disease due to COVID-19 virus  Acute respiratory failure due to COVID-19 virus, pneumonia due to COVID-19 virus infection: Continue to monitor due to significant symptoms  chest physiotherapy, incentive spirometry, deep breathing exercises, sputum induction, mucolytic's and bronchodilators. Supplemental oxygen to keep saturations more than 90%. Covid directed therapy with , steroids , started on Solu-Medrol. remdesivir, day 1/5 Baricitinib, not indicated.  He is on 3 to 4 L oxygen with mild symptoms.  His symptoms started more than a week ago. antibiotics, not indicated. Due to severity of symptoms, patient will need daily inflammatory markers, chest x-rays, liver function test to monitor and direct COVID-19 therapies.  COVID-19 Labs  Recent Labs    02/27/20 0310  DDIMER 2.14*  CRP 18.9*    Lab Results  Component Value Date   SARSCOV2NAA POSITIVE (A) 02/22/2020   SpO2: 96 % O2 Flow Rate (L/min): 4 L/min    DVT prophylaxis: enoxaparin (LOVENOX) injection 40 mg Start: 02/27/20 0330   Code Status: Full code Family Communication: None Disposition Plan: Status is: Inpatient  Remains inpatient appropriate because:IV treatments appropriate due to intensity of illness or inability to take PO and Inpatient level  of care appropriate due to severity of illness   Dispo: The patient is from: Home              Anticipated d/c is to: Home              Anticipated d/c date is: 3 days              Patient currently is not medically stable to d/c.         Consultants:   None  Procedures:   None  Antimicrobials:  Antibiotics Given (last 72 hours)    Date/Time Action Medication Dose Rate   02/27/20 0539 New Bag/Given   remdesivir 200 mg in sodium chloride 0.9% 250 mL IVPB 200 mg 580 mL/hr         Subjective: Patient was seen and examined.  Admitted morning hours by nighttime hospitalist with COVID-19 symptoms and hypoxia for about a week.  He was still in the emergency room waiting for inpatient bed assignment.  Patient stated staying comfortable on 4 L oxygen but no much change since morning.  He is not able to mobilize in the ER room.  Denies any nausea vomiting or chest pain.  Objective: Vitals:   02/27/20 1100 02/27/20 1200 02/27/20 1245 02/27/20 1300  BP: 122/68 (!) 127/99  135/81  Pulse:  88 72   Resp: 17 20 (!) 32 (!) 30  Temp:      TempSrc:      SpO2:  97% 96%   Weight:      Height:       No intake or output data in the 24 hours ending 02/27/20  1409 Filed Weights   02/27/20 0144  Weight: 95.3 kg    Examination:  General exam: Appears calm and comfortable  Remains fairly comfortable on 4 L oxygen. Respiratory system: Clear to auscultation. Respiratory effort normal.  No added sounds. Cardiovascular system: S1 & S2 heard, RRR. No JVD, murmurs, rubs, gallops or clicks. No pedal edema. Gastrointestinal system: Abdomen is nondistended, soft and nontender. No organomegaly or masses felt. Normal bowel sounds heard. Central nervous system: Alert and oriented. No focal neurological deficits. Extremities: Symmetric 5 x 5 power. Skin: No rashes, lesions or ulcers Psychiatry: Judgement and insight appear normal. Mood & affect appropriate.     Data Reviewed: I have  personally reviewed following labs and imaging studies  CBC: Recent Labs  Lab 02/27/20 0017  WBC 12.7*  NEUTROABS 11.1*  HGB 15.0  HCT 45.7  MCV 84.9  PLT 222   Basic Metabolic Panel: Recent Labs  Lab 02/27/20 0017  NA 130*  K 4.8  CL 95*  CO2 23  GLUCOSE 126*  BUN 21*  CREATININE 1.05  CALCIUM 8.5*   GFR: Estimated Creatinine Clearance: 120.1 mL/min (by C-G formula based on SCr of 1.05 mg/dL). Liver Function Tests: Recent Labs  Lab 02/27/20 0017  AST 45*  ALT 32  ALKPHOS 37*  BILITOT 0.8  PROT 8.3*  ALBUMIN 3.2*   No results for input(s): LIPASE, AMYLASE in the last 168 hours. No results for input(s): AMMONIA in the last 168 hours. Coagulation Profile: No results for input(s): INR, PROTIME in the last 168 hours. Cardiac Enzymes: No results for input(s): CKTOTAL, CKMB, CKMBINDEX, TROPONINI in the last 168 hours. BNP (last 3 results) No results for input(s): PROBNP in the last 8760 hours. HbA1C: No results for input(s): HGBA1C in the last 72 hours. CBG: No results for input(s): GLUCAP in the last 168 hours. Lipid Profile: No results for input(s): CHOL, HDL, LDLCALC, TRIG, CHOLHDL, LDLDIRECT in the last 72 hours. Thyroid Function Tests: No results for input(s): TSH, T4TOTAL, FREET4, T3FREE, THYROIDAB in the last 72 hours. Anemia Panel: No results for input(s): VITAMINB12, FOLATE, FERRITIN, TIBC, IRON, RETICCTPCT in the last 72 hours. Sepsis Labs: Recent Labs  Lab 02/27/20 0310  PROCALCITON 0.35    Recent Results (from the past 240 hour(s))  SARS CORONAVIRUS 2 (TAT 6-24 HRS) Nasopharyngeal Nasopharyngeal Swab     Status: Abnormal   Collection Time: 02/22/20 12:32 PM   Specimen: Nasopharyngeal Swab  Result Value Ref Range Status   SARS Coronavirus 2 POSITIVE (A) NEGATIVE Final    Comment: EMAILED Beverlee Nims 1935 02/22/2020 WBOND (NOTE) SARS-CoV-2 target nucleic acids are DETECTED.  The SARS-CoV-2 RNA is generally detectable in upper and  lower respiratory specimens during the acute phase of infection. Positive results are indicative of the presence of SARS-CoV-2 RNA. Clinical correlation with patient history and other diagnostic information is  necessary to determine patient infection status. Positive results do not rule out bacterial infection or co-infection with other viruses.  The expected result is Negative.  Fact Sheet for Patients: HairSlick.no  Fact Sheet for Healthcare Providers: quierodirigir.com  This test is not yet approved or cleared by the [place] FDA and  has been authorized for detection and/or diagnosis of SARS-CoV-2 by FDA under an Emergency Use Authorization (EUA). This EUA will remain  in effect (meaning this test can be used) for the duration of the COVID-19 declarat ion under Section 564(b)(1) of the Act, 21 U.S.C. section 360bbb-3(b)(1), unless the authorization is terminated or revoked sooner.  Performed at [facility], [address] [place], [place] [address]   Culture, group A strep (throat)     Status: None   Collection Time: 02/22/20  1:33 PM   Specimen: Throat  Result Value Ref Range Status   Specimen Description THROAT  Final   Special Requests NONE  Final   Culture   Final    NO GROUP A STREP (S.PYOGENES) ISOLATED Performed at [facility], [address], [place], [place] [address]    Report Status 02/25/2020 FINAL  Final         Radiology Studies: DG Chest Portable 1 View  Result Date: 02/27/2020 CLINICAL DATA:  Cough and sore throat EXAM: PORTABLE CHEST 1 VIEW COMPARISON:  06/10/2014 FINDINGS: Shallow lung inflation with bibasilar opacities. No pleural effusion or pneumothorax. Normal cardiomediastinal contours. IMPRESSION: Shallow lung inflation with bibasilar opacities, possibly atelectasis or infection. Electronically Signed   By: Deatra Robinson M.D.   On: 02/27/2020 00:58         Scheduled Meds: . vitamin C  500 mg Oral Daily  . enoxaparin (LOVENOX) injection  40 mg Subcutaneous Daily  . methylPREDNISolone (SOLU-MEDROL) injection  0.5 mg/kg Intravenous Q12H  . zinc sulfate  220 mg Oral Daily   Continuous Infusions: . [START ON 02/28/2020] remdesivir 100 mg in NS 100 mL       LOS: 0 days    Time spent: Additional 30 minutes    Dorcas Carrow, MD [facility] Pager [phone]

## 2020-02-27 NOTE — ED Notes (Signed)
RN notified Dr. Toniann Fail about pt elevated lab results.

## 2020-02-27 NOTE — ED Notes (Signed)
Pt is NSR on monitor 

## 2020-02-27 NOTE — ED Notes (Signed)
I instructed pt to used incentive spirometer 15-20 times an hour. Pt verbalized understanding.

## 2020-02-27 NOTE — ED Notes (Signed)
Pt provided lunch tray.

## 2020-02-27 NOTE — ED Notes (Signed)
Pt currently maintaining spo2 95% on 6L humidified air. Will cont to monitor for NRB requirements. Provider aware.

## 2020-02-27 NOTE — ED Notes (Addendum)
This RN called main lab and asked them to add pt's new lab orders, using the extra tubes this RN drew & sent to main lab with the initial labs ordered on pt. Lab said that they will add on the newly ordered labwork.

## 2020-02-28 ENCOUNTER — Inpatient Hospital Stay (HOSPITAL_COMMUNITY): Payer: HRSA Program

## 2020-02-28 LAB — CBC WITH DIFFERENTIAL/PLATELET
Abs Immature Granulocytes: 0.05 10*3/uL (ref 0.00–0.07)
Basophils Absolute: 0 10*3/uL (ref 0.0–0.1)
Basophils Relative: 0 %
Eosinophils Absolute: 0 10*3/uL (ref 0.0–0.5)
Eosinophils Relative: 0 %
HCT: 44.3 % (ref 39.0–52.0)
Hemoglobin: 14.1 g/dL (ref 13.0–17.0)
Immature Granulocytes: 0 %
Lymphocytes Relative: 7 %
Lymphs Abs: 0.9 10*3/uL (ref 0.7–4.0)
MCH: 26.7 pg (ref 26.0–34.0)
MCHC: 31.8 g/dL (ref 30.0–36.0)
MCV: 83.7 fL (ref 80.0–100.0)
Monocytes Absolute: 0.3 10*3/uL (ref 0.1–1.0)
Monocytes Relative: 3 %
Neutro Abs: 11.2 10*3/uL — ABNORMAL HIGH (ref 1.7–7.7)
Neutrophils Relative %: 90 %
Platelets: 270 10*3/uL (ref 150–400)
RBC: 5.29 MIL/uL (ref 4.22–5.81)
RDW: 14 % (ref 11.5–15.5)
WBC: 12.5 10*3/uL — ABNORMAL HIGH (ref 4.0–10.5)
nRBC: 0 % (ref 0.0–0.2)

## 2020-02-28 LAB — COMPREHENSIVE METABOLIC PANEL
ALT: 47 U/L — ABNORMAL HIGH (ref 0–44)
AST: 38 U/L (ref 15–41)
Albumin: 2.8 g/dL — ABNORMAL LOW (ref 3.5–5.0)
Alkaline Phosphatase: 37 U/L — ABNORMAL LOW (ref 38–126)
Anion gap: 13 (ref 5–15)
BUN: 22 mg/dL — ABNORMAL HIGH (ref 6–20)
CO2: 25 mmol/L (ref 22–32)
Calcium: 8.8 mg/dL — ABNORMAL LOW (ref 8.9–10.3)
Chloride: 96 mmol/L — ABNORMAL LOW (ref 98–111)
Creatinine, Ser: 0.79 mg/dL (ref 0.61–1.24)
GFR calc Af Amer: >60 mL/min (ref >60)
GFR calc non Af Amer: >60 mL/min (ref >60)
Glucose, Bld: 128 mg/dL — ABNORMAL HIGH (ref 70–99)
Potassium: 4.6 mmol/L (ref 3.5–5.1)
Sodium: 134 mmol/L — ABNORMAL LOW (ref 135–145)
Total Bilirubin: 0.7 mg/dL (ref 0.3–1.2)
Total Protein: 7.8 g/dL (ref 6.5–8.1)

## 2020-02-28 LAB — C-REACTIVE PROTEIN: CRP: 15.4 mg/dL — ABNORMAL HIGH (ref <1.0)

## 2020-02-28 LAB — D-DIMER, QUANTITATIVE: D-Dimer, Quant: 3.57 ug/mL-FEU — ABNORMAL HIGH (ref 0.00–0.50)

## 2020-02-28 LAB — FERRITIN: Ferritin: 1009 ng/mL — ABNORMAL HIGH (ref 24–336)

## 2020-02-28 NOTE — Progress Notes (Signed)
PROGRESS NOTE    Adrian Thomas  XFG:[id number] DOB: June 20, 1991 DOA: 02/26/2020 PCP: Patient, No Pcp Per    Brief Narrative:  29 year old male with no significant history presented to the ER with shortness of breath and nonproductive cough.  Initial symptoms started on 8/14.  Diagnosed with COVID-19 infection on 8/16.  Trying to manage at home but came to ER with worsening shortness of breath. In the emergency room, temperature 100.4, requiring 3 L of oxygen.  Chest x-ray with bilateral infiltrates.  Given IV remdesivir and IV Solu-Medrol.   Assessment & Plan:   Principal Problem:   Acute respiratory failure due to COVID-19 [facility]) Active Problems:   Acute respiratory disease due to COVID-19 virus  Acute respiratory failure due to COVID-19 virus, pneumonia due to COVID-19 virus infection: Continue to monitor due to significant symptoms  chest physiotherapy, incentive spirometry, deep breathing exercises, sputum induction, mucolytic's and bronchodilators. Supplemental oxygen to keep saturations more than 90%. Covid directed therapy with , steroids , started on Solu-Medrol. remdesivir, day 2/5 Baricitinib, not indicated.  He is on  6 L oxygen with mild symptoms.   antibiotics, not indicated. Due to severity of symptoms, patient will need daily inflammatory markers, chest x-rays, liver function test to monitor and direct COVID-19 therapies.  COVID-19 Labs  Recent Labs    02/27/20 0310 02/28/20 0836  DDIMER 2.14* 3.57*  FERRITIN  --  1,009*  CRP 18.9* 15.4*    Lab Results  Component Value Date   SARSCOV2NAA POSITIVE (A) 02/22/2020   SpO2: 92 % O2 Flow Rate (L/min): 6 L/min FiO2 (%): 100 %  Unresponsiveness at night: Patient had a period Of brief unresponsiveness at night.  He may have underlying sleep apnea.  He had no neurological deficit.  He woke up immediately with no postictal confusion.  MRI brain was normal.  Currently without neurological deficits.  Will monitor.  No  indication for starting antiseizure medication at this time.  EEG pending.   DVT prophylaxis: enoxaparin (LOVENOX) injection 40 mg Start: 02/27/20 0330   Code Status: Full code Family Communication: None Disposition Plan: Status is: Inpatient  Remains inpatient appropriate because:IV treatments appropriate due to intensity of illness or inability to take PO and Inpatient level of care appropriate due to severity of illness   Dispo: The patient is from: Home              Anticipated d/c is to: Home              Anticipated d/c date is: 3 days              Patient currently is not medically stable to d/c.         Consultants:   None  Procedures:   None  Antimicrobials:  Antibiotics Given (last 72 hours)    Date/Time Action Medication Dose Rate   02/27/20 0539 New Bag/Given   remdesivir 200 mg in sodium chloride 0.9% 250 mL IVPB 200 mg 580 mL/hr   02/28/20 1015 New Bag/Given   remdesivir 100 mg in sodium chloride 0.9 % 100 mL IVPB 100 mg 200 mL/hr         Subjective: Patient seen and examined.  Breathing looks okay.  He is a still on 4 to 6 L of oxygen.  Has some cough and pleuritic pain. Overnight events noted.  Had an episode of brief unresponsiveness where he was found on the side of the bed, he was unable to respond quickly but later on  responded.  No postictal confusion.   Patient stated he does not remember anything.  Objective: Vitals:   02/28/20 0835 02/28/20 1019 02/28/20 1020 02/28/20 1200  BP:  (!) 123/95 (!) 123/95 113/67  Pulse: 99 84 85 65  Resp: (!) 38  (!) 34 (!) 39  Temp:  98.9 F (37.2 C)    TempSrc:  Oral    SpO2: 95% 96% 96% 92%  Weight:      Height:        Intake/Output Summary (Last 24 hours) at 02/28/2020 1348 Last data filed at 02/28/2020 1050 Gross per 24 hour  Intake 100 ml  Output 650 ml  Net -550 ml   Filed Weights   02/27/20 0144  Weight: 95.3 kg    Examination:  General exam: Appears calm and comfortable  Remains  fairly comfortable on 6 L oxygen. Respiratory system: Clear to auscultation. Respiratory effort normal.  No added sounds. Cardiovascular system: S1 & S2 heard, RRR. No JVD, murmurs, rubs, gallops or clicks. No pedal edema. Gastrointestinal system: Abdomen is nondistended, soft and nontender. No organomegaly or masses felt. Normal bowel sounds heard. Central nervous system: Alert and oriented. No focal neurological deficits. Extremities: Symmetric 5 x 5 power. Skin: No rashes, lesions or ulcers Psychiatry: Judgement and insight appear normal. Mood & affect appropriate.     Data Reviewed: I have personally reviewed following labs and imaging studies  CBC: Recent Labs  Lab 02/27/20 0017 02/28/20 0836  WBC 12.7* 12.5*  NEUTROABS 11.1* 11.2*  HGB 15.0 14.1  HCT 45.7 44.3  MCV 84.9 83.7  PLT 222 270   Basic Metabolic Panel: Recent Labs  Lab 02/27/20 0017 02/28/20 0836  NA 130* 134*  K 4.8 4.6  CL 95* 96*  CO2 23 25  GLUCOSE 126* 128*  BUN 21* 22*  CREATININE 1.05 0.79  CALCIUM 8.5* 8.8*   GFR: Estimated Creatinine Clearance: 157.6 mL/min (by C-G formula based on SCr of 0.79 mg/dL). Liver Function Tests: Recent Labs  Lab 02/27/20 0017 02/28/20 0836  AST 45* 38  ALT 32 47*  ALKPHOS 37* 37*  BILITOT 0.8 0.7  PROT 8.3* 7.8  ALBUMIN 3.2* 2.8*   No results for input(s): LIPASE, AMYLASE in the last 168 hours. No results for input(s): AMMONIA in the last 168 hours. Coagulation Profile: No results for input(s): INR, PROTIME in the last 168 hours. Cardiac Enzymes: No results for input(s): CKTOTAL, CKMB, CKMBINDEX, TROPONINI in the last 168 hours. BNP (last 3 results) No results for input(s): PROBNP in the last 8760 hours. HbA1C: No results for input(s): HGBA1C in the last 72 hours. CBG: No results for input(s): GLUCAP in the last 168 hours. Lipid Profile: No results for input(s): CHOL, HDL, LDLCALC, TRIG, CHOLHDL, LDLDIRECT in the last 72 hours. Thyroid Function  Tests: No results for input(s): TSH, T4TOTAL, FREET4, T3FREE, THYROIDAB in the last 72 hours. Anemia Panel: Recent Labs    02/28/20 0836  FERRITIN 1,009*   Sepsis Labs: Recent Labs  Lab 02/27/20 0310  PROCALCITON 0.35    Recent Results (from the past 240 hour(s))  SARS CORONAVIRUS 2 (TAT 6-24 HRS) Nasopharyngeal Nasopharyngeal Swab     Status: Abnormal   Collection Time: 02/22/20 12:32 PM   Specimen: Nasopharyngeal Swab  Result Value Ref Range Status   SARS Coronavirus 2 POSITIVE (A) NEGATIVE Final    Comment: EMAILED Beverlee Nims 1935 02/22/2020 WBOND (NOTE) SARS-CoV-2 target nucleic acids are DETECTED.  The SARS-CoV-2 RNA is generally detectable in upper and lower  respiratory specimens during the acute phase of infection. Positive results are indicative of the presence of SARS-CoV-2 RNA. Clinical correlation with patient history and other diagnostic information is  necessary to determine patient infection status. Positive results do not rule out bacterial infection or co-infection with other viruses.  The expected result is Negative.  Fact Sheet for Patients: HairSlick.nohttps://www.fda.gov/media/138098/download  Fact Sheet for Healthcare Providers: quierodirigir.comhttps://www.fda.gov/media/138095/download  This test is not yet approved or cleared by the [place] FDA and  has been authorized for detection and/or diagnosis of SARS-CoV-2 by FDA under an Emergency Use Authorization (EUA). This EUA will remain  in effect (meaning this test can be used) for the duration of the COVID-19 declarat ion under Section 564(b)(1) of the Act, 21 U.S.C. section 360bbb-3(b)(1), unless the authorization is terminated or revoked sooner.   Performed at [facility], [address] [place], [place] [address]   Culture, group A strep (throat)     Status: None   Collection Time: 02/22/20  1:33 PM   Specimen: Throat  Result Value Ref Range Status   Specimen Description THROAT  Final   Special  Requests NONE  Final   Culture   Final    NO GROUP A STREP (S.PYOGENES) ISOLATED Performed at [facility], [address] [place], [place] [address]    Report Status 02/25/2020 FINAL  Final         Radiology Studies: CT HEAD WO CONTRAST  Result Date: 02/28/2020 CLINICAL DATA:  29 year old male with history of seizure. COVID-19. Abnormal neurologic examination. EXAM: CT HEAD WITHOUT CONTRAST TECHNIQUE: Contiguous axial images were obtained from the base of the skull through the vertex without intravenous contrast. COMPARISON:  Brain MRI 02/28/2020. FINDINGS: Brain: No evidence of acute infarction, hemorrhage, hydrocephalus, extra-axial collection or mass lesion/mass effect. Vascular: No hyperdense vessel or unexpected calcification. Skull: Normal. Negative for fracture or focal lesion. Sinuses/Orbits: Multifocal mucosal thickening in the frontal, anterior ethmoid and right maxillary sinuses. No air-fluid levels. Other: None. IMPRESSION: 1. No acute intracranial abnormalities. The appearance of the brain is normal. 2. Paranasal sinus disease, as above, without acute findings. Electronically Signed   By: Trudie Reedaniel  Entrikin M.D.   On: 02/28/2020 06:55   MR BRAIN WO CONTRAST  Result Date: 02/28/2020 CLINICAL DATA:  Seizure EXAM: MRI HEAD WITHOUT CONTRAST TECHNIQUE: Multiplanar, multiecho pulse sequences of the brain and surrounding structures were obtained without intravenous contrast. COMPARISON:  None. FINDINGS: BRAIN: No acute infarct, acute hemorrhage or extra-axial collection. Normal white matter signal. Normal volume of CSF spaces. No chronic microhemorrhage. Normal midline structures. The hippocampi are normal and symmetric in size and signal. The hypothalamus and mamillary bodies are normal. There is no cortical ectopia or dysplasia. VASCULAR: Major flow voids are preserved. SKULL AND UPPER CERVICAL SPINE: Normal calvarium and skull base. Visualized upper cervical spine and soft tissues  are normal. SINUSES/ORBITS: Frontal sinus mucosal thickening.  Normal orbits. IMPRESSION: Normal brain MRI. Electronically Signed   By: Deatra RobinsonKevin  Herman M.D.   On: 02/28/2020 01:04   DG Chest Portable 1 View  Result Date: 02/27/2020 CLINICAL DATA:  Cough and sore throat EXAM: PORTABLE CHEST 1 VIEW COMPARISON:  06/10/2014 FINDINGS: Shallow lung inflation with bibasilar opacities. No pleural effusion or pneumothorax. Normal cardiomediastinal contours. IMPRESSION: Shallow lung inflation with bibasilar opacities, possibly atelectasis or infection. Electronically Signed   By: Deatra RobinsonKevin  Herman M.D.   On: 02/27/2020 00:58        Scheduled Meds: . vitamin C  500 mg  Oral Daily  . enoxaparin (LOVENOX) injection  40 mg Subcutaneous Daily  . methylPREDNISolone (SOLU-MEDROL) injection  0.5 mg/kg Intravenous Q12H  . zinc sulfate  220 mg Oral Daily   Continuous Infusions: . remdesivir 100 mg in NS 100 mL Stopped (02/28/20 1050)     LOS: 1 day    Time spent: 30 minutes    Dorcas Carrow, MD [facility] Pager [phone]

## 2020-02-28 NOTE — ED Notes (Signed)
Dropped pt O2 to 4L [place] at this time due to O2 sat 98%. Pt has no complaints at this time. Will cont to monitor.

## 2020-02-29 ENCOUNTER — Inpatient Hospital Stay (HOSPITAL_COMMUNITY): Payer: HRSA Program

## 2020-02-29 DIAGNOSIS — R404 Transient alteration of awareness: Secondary | ICD-10-CM

## 2020-02-29 LAB — MAGNESIUM: Magnesium: 2.4 mg/dL (ref 1.7–2.4)

## 2020-02-29 LAB — CBC WITH DIFFERENTIAL/PLATELET
Abs Immature Granulocytes: 0.1 10*3/uL — ABNORMAL HIGH (ref 0.00–0.07)
Basophils Absolute: 0 10*3/uL (ref 0.0–0.1)
Basophils Relative: 0 %
Eosinophils Absolute: 0 10*3/uL (ref 0.0–0.5)
Eosinophils Relative: 0 %
HCT: 44 % (ref 39.0–52.0)
Hemoglobin: 14.5 g/dL (ref 13.0–17.0)
Immature Granulocytes: 1 %
Lymphocytes Relative: 5 %
Lymphs Abs: 0.8 10*3/uL (ref 0.7–4.0)
MCH: 27.9 pg (ref 26.0–34.0)
MCHC: 33 g/dL (ref 30.0–36.0)
MCV: 84.8 fL (ref 80.0–100.0)
Monocytes Absolute: 0.4 10*3/uL (ref 0.1–1.0)
Monocytes Relative: 3 %
Neutro Abs: 15 10*3/uL — ABNORMAL HIGH (ref 1.7–7.7)
Neutrophils Relative %: 91 %
Platelets: 381 10*3/uL (ref 150–400)
RBC: 5.19 MIL/uL (ref 4.22–5.81)
RDW: 14.1 % (ref 11.5–15.5)
WBC: 16.4 10*3/uL — ABNORMAL HIGH (ref 4.0–10.5)
nRBC: 0 % (ref 0.0–0.2)

## 2020-02-29 LAB — PHOSPHORUS: Phosphorus: 4.1 mg/dL (ref 2.5–4.6)

## 2020-02-29 LAB — COMPREHENSIVE METABOLIC PANEL
ALT: 47 U/L — ABNORMAL HIGH (ref 0–44)
AST: 27 U/L (ref 15–41)
Albumin: 2.9 g/dL — ABNORMAL LOW (ref 3.5–5.0)
Alkaline Phosphatase: 34 U/L — ABNORMAL LOW (ref 38–126)
Anion gap: 12 (ref 5–15)
BUN: 23 mg/dL — ABNORMAL HIGH (ref 6–20)
CO2: 26 mmol/L (ref 22–32)
Calcium: 8.9 mg/dL (ref 8.9–10.3)
Chloride: 98 mmol/L (ref 98–111)
Creatinine, Ser: 0.82 mg/dL (ref 0.61–1.24)
GFR calc Af Amer: >60 mL/min (ref >60)
GFR calc non Af Amer: >60 mL/min (ref >60)
Glucose, Bld: 127 mg/dL — ABNORMAL HIGH (ref 70–99)
Potassium: 4.7 mmol/L (ref 3.5–5.1)
Sodium: 136 mmol/L (ref 135–145)
Total Bilirubin: 0.7 mg/dL (ref 0.3–1.2)
Total Protein: 7.6 g/dL (ref 6.5–8.1)

## 2020-02-29 LAB — FERRITIN: Ferritin: 508 ng/mL — ABNORMAL HIGH (ref 24–336)

## 2020-02-29 LAB — D-DIMER, QUANTITATIVE: D-Dimer, Quant: 2.44 ug/mL-FEU — ABNORMAL HIGH (ref 0.00–0.50)

## 2020-02-29 LAB — C-REACTIVE PROTEIN: CRP: 7.1 mg/dL — ABNORMAL HIGH (ref <1.0)

## 2020-02-29 NOTE — Plan of Care (Signed)

## 2020-02-29 NOTE — Progress Notes (Signed)
PROGRESS NOTE    Adrian Thomas  XVQ:[id number] DOB: 27-Jun-1991 DOA: 02/26/2020 PCP: Patient, No Pcp Per    Brief Narrative:  29 year old male with no significant history presented to the ER with shortness of breath and nonproductive cough.  Initial symptoms started on 8/14.  Diagnosed with COVID-19 infection on 8/16.  Trying to manage at home but came to ER with worsening shortness of breath. In the emergency room, temperature 100.4, requiring 3 L of oxygen.  Chest x-ray with bilateral infiltrates.  Given IV remdesivir and IV Solu-Medrol. 8/22: Patient had a brief episode of unresponsiveness in the morning at the ER.  No neurological deficit.  Thought to be hypoxia or sleepiness.  Assessment & Plan:   Principal Problem:   Acute respiratory failure due to COVID-19 [facility]) Active Problems:   Acute respiratory disease due to COVID-19 virus  Acute respiratory failure due to COVID-19 virus, pneumonia due to COVID-19 virus infection: Continue to monitor due to significant symptoms  chest physiotherapy, incentive spirometry, deep breathing exercises, sputum induction, mucolytic's and bronchodilators. Supplemental oxygen to keep saturations more than 85 %. Covid directed therapy with , steroids , started on Solu-Medrol. remdesivir, day 3/5 Baricitinib, not indicated.  He is on  6 L oxygen with mild symptoms.   antibiotics, not indicated. Due to severity of symptoms, patient will need daily inflammatory markers, chest x-rays, liver function test to monitor and direct COVID-19 therapies.  COVID-19 Labs  Recent Labs    02/27/20 0310 02/28/20 0836 02/29/20 0741  DDIMER 2.14* 3.57* 2.44*  FERRITIN  --  1,009* 508*  CRP 18.9* 15.4* 7.1*    Lab Results  Component Value Date   SARSCOV2NAA POSITIVE (A) 02/22/2020   SpO2: 94 % O2 Flow Rate (L/min): 6 L/min FiO2 (%): 100 %  Unresponsiveness at night: Patient had a period Of brief unresponsiveness at night.  He may have underlying sleep  apnea.  He had no neurological deficit.  He woke up immediately with no postictal confusion.  MRI brain was normal.  Currently without neurological deficits.  Will monitor.  No indication for starting antiseizure medication at this time.  EEG pending.   DVT prophylaxis: enoxaparin (LOVENOX) injection 40 mg Start: 02/27/20 0330   Code Status: Full code Family Communication: None Disposition Plan: Status is: Inpatient  Remains inpatient appropriate because:IV treatments appropriate due to intensity of illness or inability to take PO and Inpatient level of care appropriate due to severity of illness   Dispo: The patient is from: Home              Anticipated d/c is to: Home              Anticipated d/c date is: 3 days              Patient currently is not medically stable to d/c.         Consultants:   None  Procedures:   None  Antimicrobials:  Antibiotics Given (last 72 hours)    Date/Time Action Medication Dose Rate   02/27/20 0539 New Bag/Given   remdesivir 200 mg in sodium chloride 0.9% 250 mL IVPB 200 mg 580 mL/hr   02/28/20 1015 New Bag/Given   remdesivir 100 mg in sodium chloride 0.9 % 100 mL IVPB 100 mg 200 mL/hr   02/29/20 1005 New Bag/Given   remdesivir 100 mg in sodium chloride 0.9 % 100 mL IVPB 100 mg 200 mL/hr         Subjective: Seen and examined.  Still on 4 to 6 L oxygen.  He himself feels okay and comfortable.  Has cough unable to expectorate the sputum.  Afebrile.  No more unresponsive episodes. Objective: Vitals:   02/28/20 2141 02/29/20 0210 02/29/20 0400 02/29/20 0751  BP: 137/78 128/74 113/78 126/81  Pulse: 92 64 70 77  Resp: 18 (!) 21 (!) 23 (!) 24  Temp: 98.5 F (36.9 C) 98.1 F (36.7 C) 98 F (36.7 C) 98.7 F (37.1 C)  TempSrc: Oral Oral Oral Oral  SpO2: 93% (!) 88% 91% 94%  Weight:      Height:        Intake/Output Summary (Last 24 hours) at 02/29/2020 1255 Last data filed at 02/29/2020 0100 Gross per 24 hour  Intake --  Output  600 ml  Net -600 ml   Filed Weights   02/27/20 0144  Weight: 95.3 kg    Examination:  General exam: Appears calm and comfortable  Remains fairly comfortable on 6 L oxygen. Respiratory system: Clear to auscultation. Respiratory effort normal.  No added sounds. Cardiovascular system: S1 & S2 heard, RRR. No JVD, murmurs, rubs, gallops or clicks. No pedal edema. Gastrointestinal system: Abdomen is nondistended, soft and nontender. No organomegaly or masses felt. Normal bowel sounds heard. Central nervous system: Alert and oriented. No focal neurological deficits. Extremities: Symmetric 5 x 5 power. Skin: No rashes, lesions or ulcers Psychiatry: Judgement and insight appear normal. Mood & affect appropriate.     Data Reviewed: I have personally reviewed following labs and imaging studies  CBC: Recent Labs  Lab 02/27/20 0017 02/28/20 0836 02/29/20 0741  WBC 12.7* 12.5* 16.4*  NEUTROABS 11.1* 11.2* 15.0*  HGB 15.0 14.1 14.5  HCT 45.7 44.3 44.0  MCV 84.9 83.7 84.8  PLT 222 270 381   Basic Metabolic Panel: Recent Labs  Lab 02/27/20 0017 02/28/20 0836 02/29/20 0741  NA 130* 134* 136  K 4.8 4.6 4.7  CL 95* 96* 98  CO2 23 25 26   GLUCOSE 126* 128* 127*  BUN 21* 22* 23*  CREATININE 1.05 0.79 0.82  CALCIUM 8.5* 8.8* 8.9  MG  --   --  2.4  PHOS  --   --  4.1   GFR: Estimated Creatinine Clearance: 153.8 mL/min (by C-G formula based on SCr of 0.82 mg/dL). Liver Function Tests: Recent Labs  Lab 02/27/20 0017 02/28/20 0836 02/29/20 0741  AST 45* 38 27  ALT 32 47* 47*  ALKPHOS 37* 37* 34*  BILITOT 0.8 0.7 0.7  PROT 8.3* 7.8 7.6  ALBUMIN 3.2* 2.8* 2.9*   No results for input(s): LIPASE, AMYLASE in the last 168 hours. No results for input(s): AMMONIA in the last 168 hours. Coagulation Profile: No results for input(s): INR, PROTIME in the last 168 hours. Cardiac Enzymes: No results for input(s): CKTOTAL, CKMB, CKMBINDEX, TROPONINI in the last 168 hours. BNP (last 3  results) No results for input(s): PROBNP in the last 8760 hours. HbA1C: No results for input(s): HGBA1C in the last 72 hours. CBG: No results for input(s): GLUCAP in the last 168 hours. Lipid Profile: No results for input(s): CHOL, HDL, LDLCALC, TRIG, CHOLHDL, LDLDIRECT in the last 72 hours. Thyroid Function Tests: No results for input(s): TSH, T4TOTAL, FREET4, T3FREE, THYROIDAB in the last 72 hours. Anemia Panel: Recent Labs    02/28/20 0836 02/29/20 0741  FERRITIN 1,009* 508*   Sepsis Labs: Recent Labs  Lab 02/27/20 0310  PROCALCITON 0.35    Recent Results (from the past 240 hour(s))  SARS CORONAVIRUS  2 (TAT 6-24 HRS) Nasopharyngeal Nasopharyngeal Swab     Status: Abnormal   Collection Time: 02/22/20 12:32 PM   Specimen: Nasopharyngeal Swab  Result Value Ref Range Status   SARS Coronavirus 2 POSITIVE (A) NEGATIVE Final    Comment: EMAILED Beverlee Nims 1935 02/22/2020 WBOND (NOTE) SARS-CoV-2 target nucleic acids are DETECTED.  The SARS-CoV-2 RNA is generally detectable in upper and lower respiratory specimens during the acute phase of infection. Positive results are indicative of the presence of SARS-CoV-2 RNA. Clinical correlation with patient history and other diagnostic information is  necessary to determine patient infection status. Positive results do not rule out bacterial infection or co-infection with other viruses.  The expected result is Negative.  Fact Sheet for Patients: HairSlick.no  Fact Sheet for Healthcare Providers: quierodirigir.com  This test is not yet approved or cleared by the [place] FDA and  has been authorized for detection and/or diagnosis of SARS-CoV-2 by FDA under an Emergency Use Authorization (EUA). This EUA will remain  in effect (meaning this test can be used) for the duration of the COVID-19 declarat ion under Section 564(b)(1) of the Act, 21 U.S.C. section  360bbb-3(b)(1), unless the authorization is terminated or revoked sooner.   Performed at [facility], [address], [place], [place] [address]   Culture, group A strep (throat)     Status: None   Collection Time: 02/22/20  1:33 PM   Specimen: Throat  Result Value Ref Range Status   Specimen Description THROAT  Final   Special Requests NONE  Final   Culture   Final    NO GROUP A STREP (S.PYOGENES) ISOLATED Performed at [facility], [address] [place], [place] [address]    Report Status 02/25/2020 FINAL  Final         Radiology Studies: CT HEAD WO CONTRAST  Result Date: 02/28/2020 CLINICAL DATA:  29 year old male with history of seizure. COVID-19. Abnormal neurologic examination. EXAM: CT HEAD WITHOUT CONTRAST TECHNIQUE: Contiguous axial images were obtained from the base of the skull through the vertex without intravenous contrast. COMPARISON:  Brain MRI 02/28/2020. FINDINGS: Brain: No evidence of acute infarction, hemorrhage, hydrocephalus, extra-axial collection or mass lesion/mass effect. Vascular: No hyperdense vessel or unexpected calcification. Skull: Normal. Negative for fracture or focal lesion. Sinuses/Orbits: Multifocal mucosal thickening in the frontal, anterior ethmoid and right maxillary sinuses. No air-fluid levels. Other: None. IMPRESSION: 1. No acute intracranial abnormalities. The appearance of the brain is normal. 2. Paranasal sinus disease, as above, without acute findings. Electronically Signed   By: Trudie Reed M.D.   On: 02/28/2020 06:55   MR BRAIN WO CONTRAST  Result Date: 02/28/2020 CLINICAL DATA:  Seizure EXAM: MRI HEAD WITHOUT CONTRAST TECHNIQUE: Multiplanar, multiecho pulse sequences of the brain and surrounding structures were obtained without intravenous contrast. COMPARISON:  None. FINDINGS: BRAIN: No acute infarct, acute hemorrhage or extra-axial collection. Normal white matter signal. Normal volume of CSF spaces. No chronic  microhemorrhage. Normal midline structures. The hippocampi are normal and symmetric in size and signal. The hypothalamus and mamillary bodies are normal. There is no cortical ectopia or dysplasia. VASCULAR: Major flow voids are preserved. SKULL AND UPPER CERVICAL SPINE: Normal calvarium and skull base. Visualized upper cervical spine and soft tissues are normal. SINUSES/ORBITS: Frontal sinus mucosal thickening.  Normal orbits. IMPRESSION: Normal brain MRI. Electronically Signed   By: Deatra Robinson M.D.   On: 02/28/2020 01:04        Scheduled Meds: . vitamin C  500 mg Oral Daily  . enoxaparin (LOVENOX) injection  40 mg Subcutaneous Daily  . methylPREDNISolone (SOLU-MEDROL) injection  0.5 mg/kg Intravenous Q12H  . zinc sulfate  220 mg Oral Daily   Continuous Infusions: . remdesivir 100 mg in NS 100 mL 100 mg (02/29/20 1005)     LOS: 2 days    Time spent: 30 minutes    Dorcas CarrowKuber Dottie Vaquerano, MD [facility] Pager [phone]

## 2020-02-29 NOTE — Procedures (Signed)
Patient Name: Adrian Thomas  MRN: [id number]  Epilepsy Attending: Charlsie Quest  Referring Physician/Provider: Dr Trey Paula Date: 02/29/2020 Duration: 28.14 mins  Patient history: 29yo M with transient alteration of awareness at night. EEG to evaluate for seizure.  Level of alertness: Awake, asleep  AEDs during EEG study: None  Technical aspects: This EEG study was done with scalp electrodes positioned according to the 10-20 International system of electrode placement. Electrical activity was acquired at a sampling rate of 500Hz  and reviewed with a high frequency filter of 70Hz  and a low frequency filter of 1Hz . EEG data were recorded continuously and digitally stored.   Description: The posterior dominant rhythm consists of 9-10 Hz activity of moderate voltage (25-35 uV) seen predominantly in posterior head regions, symmetric and reactive to eye opening and eye closing. Sleep was characterized by vertex waves, sleep spindles (12 to 14 Hz), maximal frontocentral region. Physiologic photic driving was seen during photic stimulation. Hyperventilation was not performed.     IMPRESSION: This study is within normal limits. No seizures or epileptiform discharges were seen throughout the recording.  Jenisha Faison 

## 2020-02-29 NOTE — Progress Notes (Addendum)
EEG complete - results pending 

## 2020-03-01 ENCOUNTER — Inpatient Hospital Stay (HOSPITAL_COMMUNITY): Payer: HRSA Program

## 2020-03-01 MED ORDER — IOHEXOL 300 MG/ML  SOLN
75.0000 mL | Freq: Once | INTRAMUSCULAR | Status: AC | PRN
  Administered 2020-03-01: 75 mL via INTRAVENOUS

## 2020-03-01 MED ORDER — ENOXAPARIN SODIUM 100 MG/ML ~~LOC~~ SOLN
1.0000 mg/kg | Freq: Two times a day (BID) | SUBCUTANEOUS | Status: DC
  Administered 2020-03-01 – 2020-03-02 (×3): 95 mg via SUBCUTANEOUS
  Filled 2020-03-01 (×3): qty 1

## 2020-03-01 NOTE — Progress Notes (Signed)
PROGRESS NOTE    Telly Jawad  XKG:[id number] DOB: [birth year] DOA: 02/26/2020 PCP: Patient, No Pcp Per    Brief Narrative:  29 year old male with no significant history presented to the ER with shortness of breath and nonproductive cough.  Initial symptoms started on 8/14.  Diagnosed with COVID-19 infection on 8/16.  Trying to manage at home but came to ER with worsening shortness of breath. In the emergency room, temperature 100.4, requiring 3 L of oxygen.  Chest x-ray with bilateral infiltrates.  Given IV remdesivir and IV Solu-Medrol. 8/22: Patient had a brief episode of unresponsiveness in the morning at the ER.  No neurological deficit.  Thought to be hypoxia or sleepiness.  Assessment & Plan:   Principal Problem:   Acute respiratory failure due to COVID-19 [facility]) Active Problems:   Acute respiratory disease due to COVID-19 virus  Acute respiratory failure due to COVID-19 virus, pneumonia due to COVID-19 virus infection: Continue to monitor due to significant symptoms , patient is anywhere between needing 6 to 8 L of oxygen. chest physiotherapy, incentive spirometry, deep breathing exercises, sputum induction, mucolytic's and bronchodilators. Supplemental oxygen to keep saturations more than 85 %. Covid directed therapy with , steroids , started on Solu-Medrol. remdesivir, day 4/5 Baricitinib, not indicated.  He is on  6-8 L oxygen with mild symptoms in late presentation. antibiotics, not indicated. Due to severity of symptoms, patient will need daily inflammatory markers, chest x-rays, liver function test to monitor and direct COVID-19 therapies. Continues to require high flow oxygen, will check CTA of the chest today to rule out pulmonary embolism as well as to learn the extent of lung involvement. Will change to therapeutic Lovenox today until clinical improvement. Start mobilizing.  COVID-19 Labs  Recent Labs    02/28/20 0836 02/29/20 0741  DDIMER 3.57* 2.44*  FERRITIN  1,009* 508*  CRP 15.4* 7.1*    Lab Results  Component Value Date   SARSCOV2NAA POSITIVE (A) 02/22/2020   SpO2: 93 % O2 Flow Rate (L/min): 8 L/min FiO2 (%): 100 %  Unresponsiveness at night: Patient had a period Of brief unresponsiveness at night.  He may have underlying sleep apnea.  He had no neurological deficit.  He woke up immediately with no postictal confusion.  MRI brain was normal.  Currently without neurological deficits.  EEG was normal.     DVT prophylaxis: Therapeutic Lovenox.   Code Status: Full code Family Communication: None.  Patient communicating with family. Disposition Plan: Status is: Inpatient  Remains inpatient appropriate because:IV treatments appropriate due to intensity of illness or inability to take PO and Inpatient level of care appropriate due to severity of illness   Dispo: The patient is from: Home              Anticipated d/c is to: Home              Anticipated d/c date is: 3 days              Patient currently is not medically stable to d/c.         Consultants:   None  Procedures:   None  Antimicrobials:  Antibiotics Given (last 72 hours)    Date/Time Action Medication Dose Rate   02/28/20 1015 New Bag/Given   remdesivir 100 mg in sodium chloride 0.9 % 100 mL IVPB 100 mg 200 mL/hr   02/29/20 1005 New Bag/Given   remdesivir 100 mg in sodium chloride 0.9 % 100 mL IVPB 100 mg 200 mL/hr  03/01/20 1020 New Bag/Given   remdesivir 100 mg in sodium chloride 0.9 % 100 mL IVPB 100 mg 200 mL/hr         Subjective: Seen and examined.  Himself feels okay.  Needing anywhere between 6 to 8 L of oxygen.  Has some pleuritic chest pain on deep breathing. Objective: Vitals:   02/29/20 1716 02/29/20 1722 02/29/20 2000 03/01/20 0400  BP: 136/90 122/68 130/89 129/76  Pulse: 77 71 93 94  Resp: (!) 30 (!) 22 20 19   Temp: 98.7 F (37.1 C) 98.7 F (37.1 C) 98.3 F (36.8 C) 98.9 F (37.2 C)  TempSrc: Oral Oral Oral Axillary  SpO2: 90%  (!) 86% 90% 93%  Weight:      Height:        Intake/Output Summary (Last 24 hours) at 03/01/2020 1226 Last data filed at 03/01/2020 0420 Gross per 24 hour  Intake --  Output 1350 ml  Net -1350 ml   Filed Weights   02/27/20 0144  Weight: 95.3 kg    Examination:  General exam: Appears calm and comfortable  Remains fairly comfortable on 8 L oxygen. Respiratory system: Clear to auscultation. Respiratory effort normal.  No added sounds. Cardiovascular system: S1 & S2 heard, RRR. No JVD, murmurs, rubs, gallops or clicks. No pedal edema. Gastrointestinal system: Abdomen is nondistended, soft and nontender. No organomegaly or masses felt. Normal bowel sounds heard. Central nervous system: Alert and oriented. No focal neurological deficits. Extremities: Symmetric 5 x 5 power. Skin: No rashes, lesions or ulcers Psychiatry: Judgement and insight appear normal. Mood & affect appropriate.     Data Reviewed: I have personally reviewed following labs and imaging studies  CBC: Recent Labs  Lab 02/27/20 0017 02/28/20 0836 02/29/20 0741  WBC 12.7* 12.5* 16.4*  NEUTROABS 11.1* 11.2* 15.0*  HGB 15.0 14.1 14.5  HCT 45.7 44.3 44.0  MCV 84.9 83.7 84.8  PLT 222 270 381   Basic Metabolic Panel: Recent Labs  Lab 02/27/20 0017 02/28/20 0836 02/29/20 0741  NA 130* 134* 136  K 4.8 4.6 4.7  CL 95* 96* 98  CO2 23 25 26   GLUCOSE 126* 128* 127*  BUN 21* 22* 23*  CREATININE 1.05 0.79 0.82  CALCIUM 8.5* 8.8* 8.9  MG  --   --  2.4  PHOS  --   --  4.1   GFR: Estimated Creatinine Clearance: 153.8 mL/min (by C-G formula based on SCr of 0.82 mg/dL). Liver Function Tests: Recent Labs  Lab 02/27/20 0017 02/28/20 0836 02/29/20 0741  AST 45* 38 27  ALT 32 47* 47*  ALKPHOS 37* 37* 34*  BILITOT 0.8 0.7 0.7  PROT 8.3* 7.8 7.6  ALBUMIN 3.2* 2.8* 2.9*   No results for input(s): LIPASE, AMYLASE in the last 168 hours. No results for input(s): AMMONIA in the last 168 hours. Coagulation  Profile: No results for input(s): INR, PROTIME in the last 168 hours. Cardiac Enzymes: No results for input(s): CKTOTAL, CKMB, CKMBINDEX, TROPONINI in the last 168 hours. BNP (last 3 results) No results for input(s): PROBNP in the last 8760 hours. HbA1C: No results for input(s): HGBA1C in the last 72 hours. CBG: No results for input(s): GLUCAP in the last 168 hours. Lipid Profile: No results for input(s): CHOL, HDL, LDLCALC, TRIG, CHOLHDL, LDLDIRECT in the last 72 hours. Thyroid Function Tests: No results for input(s): TSH, T4TOTAL, FREET4, T3FREE, THYROIDAB in the last 72 hours. Anemia Panel: Recent Labs    02/28/20 0836 02/29/20 0741  FERRITIN 1,009* 508*  Sepsis Labs: Recent Labs  Lab 02/27/20 0310  PROCALCITON 0.35    Recent Results (from the past 240 hour(s))  SARS CORONAVIRUS 2 (TAT 6-24 HRS) Nasopharyngeal Nasopharyngeal Swab     Status: Abnormal   Collection Time: 02/22/20 12:32 PM   Specimen: Nasopharyngeal Swab  Result Value Ref Range Status   SARS Coronavirus 2 POSITIVE (A) NEGATIVE Final    Comment: EMAILED Beverlee Nims 1935 02/22/2020 WBOND (NOTE) SARS-CoV-2 target nucleic acids are DETECTED.  The SARS-CoV-2 RNA is generally detectable in upper and lower respiratory specimens during the acute phase of infection. Positive results are indicative of the presence of SARS-CoV-2 RNA. Clinical correlation with patient history and other diagnostic information is  necessary to determine patient infection status. Positive results do not rule out bacterial infection or co-infection with other viruses.  The expected result is Negative.  Fact Sheet for Patients: HairSlick.no  Fact Sheet for Healthcare Providers: quierodirigir.com  This test is not yet approved or cleared by the [place] FDA and  has been authorized for detection and/or diagnosis of SARS-CoV-2 by FDA under an Emergency Use Authorization  (EUA). This EUA will remain  in effect (meaning this test can be used) for the duration of the COVID-19 declarat ion under Section 564(b)(1) of the Act, 21 U.S.C. section 360bbb-3(b)(1), unless the authorization is terminated or revoked sooner.   Performed at [facility], [address], [place], [place] [address]   Culture, group A strep (throat)     Status: None   Collection Time: 02/22/20  1:33 PM   Specimen: Throat  Result Value Ref Range Status   Specimen Description THROAT  Final   Special Requests NONE  Final   Culture   Final    NO GROUP A STREP (S.PYOGENES) ISOLATED Performed at [facility], [address], [place], [place] [address]    Report Status 02/25/2020 FINAL  Final         Radiology Studies: EEG adult  Result Date: 2020/03/04 Charlsie Quest, MD     2020/03/04  4:44 PM Patient Name: Kalei Mckillop MRN: [id number] Epilepsy Attending: Charlsie Quest Referring Physician/Provider: Dr Trey Paula Date: 03-04-2020 Duration: 28.14 mins Patient history: 29yo M with transient alteration of awareness at night. EEG to evaluate for seizure. Level of alertness: Awake, asleep AEDs during EEG study: None Technical aspects: This EEG study was done with scalp electrodes positioned according to the 10-20 International system of electrode placement. Electrical activity was acquired at a sampling rate of 500Hz  and reviewed with a high frequency filter of 70Hz  and a low frequency filter of 1Hz . EEG data were recorded continuously and digitally stored. Description: The posterior dominant rhythm consists of 9-10 Hz activity of moderate voltage (25-35 uV) seen predominantly in posterior head regions, symmetric and reactive to eye opening and eye closing. Sleep was characterized by vertex waves, sleep spindles (12 to 14 Hz), maximal frontocentral region. Physiologic photic driving was seen during photic stimulation. Hyperventilation was not performed.   IMPRESSION: This study  is within normal limits. No seizures or epileptiform discharges were seen throughout the recording. Priyanka        Scheduled Meds: . vitamin C  500 mg Oral Daily  . enoxaparin (LOVENOX) injection  1 mg/kg Subcutaneous Q12H  . methylPREDNISolone (SOLU-MEDROL) injection  0.5 mg/kg Intravenous Q12H  . zinc sulfate  220 mg Oral Daily   Continuous Infusions: . remdesivir 100 mg in NS 100 mL 100 mg (03/01/20 1020)  LOS: 3 days    Time spent: 30 minutes    Dorcas Carrow, MD [facility] Pager [phone]

## 2020-03-01 NOTE — TOC Initial Note (Signed)
Transition of Care [facility]) - Initial/Assessment Note    Patient Details  Name: Adrian Thomas MRN: [id number] Date of Birth: Nov 19, 1990  Transition [facility]) CM/SW Contact:    Lawerance Sabal, RN Phone Number: 03/01/2020, 10:36 AM  Clinical Narrative:                 Appointment made at COVID clinic 9/16 at 10:15, entered on AVS. TOC continue to follow for DC and MATCH needs   Expected Discharge Plan: Home/Self Care Barriers to Discharge: Continued Medical Work up   Patient Goals and CMS Choice        Expected Discharge Plan and Services Expected Discharge Plan: Home/Self Care       Living arrangements for the past 2 months: Single Family Home                                      Prior Living Arrangements/Services Living arrangements for the past 2 months: Single Family Home Lives with:: Significant Other Patient language and need for interpreter reviewed:: Yes Do you feel safe going back to the place where you live?: Yes      Need for Family Participation in Patient Care: Yes (Comment) Care giver support system in place?: Yes (comment)   Criminal Activity/Legal Involvement Pertinent to Current Situation/Hospitalization: No - Comment as needed  Activities of Daily Living Home Assistive Devices/Equipment: None ADL Screening (condition at time of admission) Patient's cognitive ability adequate to safely complete daily activities?: Yes Is the patient deaf or have difficulty hearing?: No Does the patient have difficulty seeing, even when wearing glasses/contacts?: No Does the patient have difficulty concentrating, remembering, or making decisions?: No Patient able to express need for assistance with ADLs?: No Does the patient have difficulty dressing or bathing?: No Independently performs ADLs?: Yes (appropriate for developmental age) Does the patient have difficulty walking or climbing stairs?: Yes Weakness of Legs: None Weakness of Arms/Hands:  None  Permission Sought/Granted                  Emotional Assessment       Orientation: : Oriented to Self, Oriented to Place, Oriented to  Time Alcohol / Substance Use: Not Applicable Psych Involvement: No (comment)  Admission diagnosis:  Hypoxia [R09.02] Acute respiratory failure due to COVID-19 (HCC) [U07.1, J96.00] Acute respiratory disease due to COVID-19 virus [U07.1, J06.9] COVID-19 [U07.1] Patient Active Problem List   Diagnosis Date Noted  . Acute respiratory failure due to COVID-19 (HCC) 02/27/2020  . Acute respiratory disease due to COVID-19 virus 02/27/2020   PCP:  Patient, No Pcp Per Pharmacy:   [id number] [place], [place] - [address] [place] [address] Phone: [phone] Fax: [phone]     Social Determinants of Health (SDOH) Interventions    Readmission Risk Interventions No flowsheet data found.

## 2020-03-02 LAB — COMPREHENSIVE METABOLIC PANEL
ALT: 34 U/L (ref 0–44)
AST: 21 U/L (ref 15–41)
Albumin: 3.1 g/dL — ABNORMAL LOW (ref 3.5–5.0)
Alkaline Phosphatase: 38 U/L (ref 38–126)
Anion gap: 11 (ref 5–15)
BUN: 22 mg/dL — ABNORMAL HIGH (ref 6–20)
CO2: 26 mmol/L (ref 22–32)
Calcium: 8.9 mg/dL (ref 8.9–10.3)
Chloride: 98 mmol/L (ref 98–111)
Creatinine, Ser: 0.87 mg/dL (ref 0.61–1.24)
GFR calc Af Amer: >60 mL/min (ref >60)
GFR calc non Af Amer: >60 mL/min (ref >60)
Glucose, Bld: 114 mg/dL — ABNORMAL HIGH (ref 70–99)
Potassium: 4.8 mmol/L (ref 3.5–5.1)
Sodium: 135 mmol/L (ref 135–145)
Total Bilirubin: 1 mg/dL (ref 0.3–1.2)
Total Protein: 7.6 g/dL (ref 6.5–8.1)

## 2020-03-02 LAB — CBC WITH DIFFERENTIAL/PLATELET
Abs Immature Granulocytes: 0.16 10*3/uL — ABNORMAL HIGH (ref 0.00–0.07)
Basophils Absolute: 0 10*3/uL (ref 0.0–0.1)
Basophils Relative: 0 %
Eosinophils Absolute: 0 10*3/uL (ref 0.0–0.5)
Eosinophils Relative: 0 %
HCT: 47.5 % (ref 39.0–52.0)
Hemoglobin: 15 g/dL (ref 13.0–17.0)
Immature Granulocytes: 1 %
Lymphocytes Relative: 5 %
Lymphs Abs: 0.9 10*3/uL (ref 0.7–4.0)
MCH: 27 pg (ref 26.0–34.0)
MCHC: 31.6 g/dL (ref 30.0–36.0)
MCV: 85.6 fL (ref 80.0–100.0)
Monocytes Absolute: 0.5 10*3/uL (ref 0.1–1.0)
Monocytes Relative: 3 %
Neutro Abs: 15.4 10*3/uL — ABNORMAL HIGH (ref 1.7–7.7)
Neutrophils Relative %: 91 %
Platelets: 435 10*3/uL — ABNORMAL HIGH (ref 150–400)
RBC: 5.55 MIL/uL (ref 4.22–5.81)
RDW: 13.9 % (ref 11.5–15.5)
WBC: 17 10*3/uL — ABNORMAL HIGH (ref 4.0–10.5)
nRBC: 0 % (ref 0.0–0.2)

## 2020-03-02 LAB — MAGNESIUM: Magnesium: 2.2 mg/dL (ref 1.7–2.4)

## 2020-03-02 LAB — FERRITIN: Ferritin: 371 ng/mL — ABNORMAL HIGH (ref 24–336)

## 2020-03-02 LAB — PHOSPHORUS: Phosphorus: 3.5 mg/dL (ref 2.5–4.6)

## 2020-03-02 LAB — D-DIMER, QUANTITATIVE: D-Dimer, Quant: 2.39 ug/mL-FEU — ABNORMAL HIGH (ref 0.00–0.50)

## 2020-03-02 MED ORDER — ENOXAPARIN SODIUM 60 MG/0.6ML ~~LOC~~ SOLN
50.0000 mg | SUBCUTANEOUS | Status: DC
  Administered 2020-03-03: 50 mg via SUBCUTANEOUS
  Filled 2020-03-02: qty 0.6

## 2020-03-02 NOTE — Progress Notes (Signed)
PROGRESS NOTE    Adrian Thomas  ZOX:[id number] DOB: [birth year] DOA: 02/26/2020 PCP: Patient, No Pcp Per   Chief Complaint  Patient presents with  . Covid Exposure    covid +    Brief Narrative:  29 year old male with no significant history presented to the ER with shortness of breath and nonproductive cough.  Initial symptoms started on 8/14.  Diagnosed with COVID-19 infection on 8/16.  Trying to manage at home but came to ER with worsening shortness of breath. In the emergency room, temperature 100.4, requiring 3 L of oxygen.  Chest x-ray with bilateral infiltrates.  Given IV remdesivir and IV Solu-Medrol. 8/22: Patient had Adrian Thomas brief episode of unresponsiveness in the morning at the ER.  No neurological deficit.  Thought to be hypoxia or sleepiness.  Assessment & Plan:   Principal Problem:   Acute respiratory failure due to COVID-19 [facility]) Active Problems:   Acute respiratory disease due to COVID-19 virus  Acute respiratory failure due to COVID-19 virus, pneumonia due to COVID-19 virus infection: CT scan 8/24 with bilateral covid 19 pneumonia Continues on 6-8 L [place] -> wean today (able to wean at bedside to 4 L, maintaining in 90's) Steroids, remdesivir (completed remdesivir 8/25) Consider baricitinib if worsening Inflammatory labs relatively stable Strict I/O, daily weights Prone as able, OOB, IS, flutter  COVID-19 Labs  Recent Labs    02/29/20 0741 03/02/20 0808  DDIMER 2.44* 2.39*  FERRITIN 508* 371*  CRP 7.1*  --     Lab Results  Component Value Date   SARSCOV2NAA POSITIVE (Adrian Thomas) 02/22/2020   Elevated D Dimer: negative CT PE protocol.  Follow LE [place].  Unresponsiveness at Night: see note from 8/21 at 2333.  Pt found unresponsive with shallow breathing and seizure like activity.  Negative MRI brain and EEG.  Per previous provider, sounds like he awoke immediately without postictal period.    DVT prophylaxis: resume prophylactic lovenox Code Status: full  Family  Communication: none at bedsdie Disposition:   Status is: Inpatient  Remains inpatient appropriate because:Inpatient level of care appropriate due to severity of illness   Dispo: The patient is from: Home              Anticipated d/c is to: Home              Anticipated d/c date is: 2 days              Patient currently is not medically stable to d/c.   Consultants:   none  Procedures:  EEG IMPRESSION: This study is within normal limits. No seizures or epileptiform discharges were seen throughout the recording.   Antimicrobials: Anti-infectives (From admission, onward)   Start     Dose/Rate Route Frequency Ordered Stop   02/28/20 1000  remdesivir 100 mg in sodium chloride 0.9 % 100 mL IVPB       "Followed by" Linked Group Details   100 mg 200 mL/hr over 30 Minutes Intravenous Daily 02/27/20 0315 03/02/20 0937   02/27/20 0430  remdesivir 200 mg in sodium chloride 0.9% 250 mL IVPB       "Followed by" Linked Group Details   200 mg 580 mL/hr over 30 Minutes Intravenous Once 02/27/20 0315 02/27/20 0618      Subjective: Feeling better, no new complaints  Objective: Vitals:   03/01/20 1441 03/01/20 2104 03/02/20 0445 03/02/20 1300  BP: (!) 117/107 121/63 108/80 111/88  Pulse: 91 75 73 84  Resp: 20 (!) 22 20 (!) 22  Temp: 98.6 F (37 C) 99.1 F (37.3 C) 98.4 F (36.9 C) 98.5 F (36.9 C)  TempSrc: Oral Oral Oral Oral  SpO2: 94% 93% 92% 90%  Weight:      Height:        Intake/Output Summary (Last 24 hours) at 03/02/2020 1654 Last data filed at 03/02/2020 1326 Gross per 24 hour  Intake 480 ml  Output 1550 ml  Net -1070 ml   Filed Weights   02/27/20 0144  Weight: 95.3 kg    Examination:  General exam: Appears calm and comfortable  Respiratory system: unlabored Cardiovascular system: RRR.  Gastrointestinal system: Abdomen is nondistended, soft and nontende Central nervous system: Alert and oriented. No focal neurological deficits. Extremities: no LEE Skin:  No rashes, lesions or ulcers Psychiatry: Judgement and insight appear normal. Mood & affect appropriate.     Data Reviewed: I have personally reviewed following labs and imaging studies  CBC: Recent Labs  Lab 02/27/20 0017 02/28/20 0836 02/29/20 0741 03/02/20 0808  WBC 12.7* 12.5* 16.4* 17.0*  NEUTROABS 11.1* 11.2* 15.0* 15.4*  HGB 15.0 14.1 14.5 15.0  HCT 45.7 44.3 44.0 47.5  MCV 84.9 83.7 84.8 85.6  PLT 222 270 381 435*    Basic Metabolic Panel: Recent Labs  Lab 02/27/20 0017 02/28/20 0836 02/29/20 0741 03/02/20 0808  NA 130* 134* 136 135  K 4.8 4.6 4.7 4.8  CL 95* 96* 98 98  CO2 23 25 26 26   GLUCOSE 126* 128* 127* 114*  BUN 21* 22* 23* 22*  CREATININE 1.05 0.79 0.82 0.87  CALCIUM 8.5* 8.8* 8.9 8.9  MG  --   --  2.4 2.2  PHOS  --   --  4.1 3.5    GFR: Estimated Creatinine Clearance: 145 mL/min (by C-G formula based on SCr of 0.87 mg/dL).  Liver Function Tests: Recent Labs  Lab 02/27/20 0017 02/28/20 0836 02/29/20 0741 03/02/20 0808  AST 45* 38 27 21  ALT 32 47* 47* 34  ALKPHOS 37* 37* 34* 38  BILITOT 0.8 0.7 0.7 1.0  PROT 8.3* 7.8 7.6 7.6  ALBUMIN 3.2* 2.8* 2.9* 3.1*    CBG: No results for input(s): GLUCAP in the last 168 hours.   Recent Results (from the past 240 hour(s))  SARS CORONAVIRUS 2 (TAT 6-24 HRS) Nasopharyngeal Nasopharyngeal Swab     Status: Abnormal   Collection Time: 02/22/20 12:32 PM   Specimen: Nasopharyngeal Swab  Result Value Ref Range Status   SARS Coronavirus 2 POSITIVE (Adrian Thomas) NEGATIVE Final    Comment: EMAILED 02/24/20 1935 02/22/2020 WBOND (NOTE) SARS-CoV-2 target nucleic acids are DETECTED.  The SARS-CoV-2 RNA is generally detectable in upper and lower respiratory specimens during the acute phase of infection. Positive results are indicative of the presence of SARS-CoV-2 RNA. Clinical correlation with patient history and other diagnostic information is  necessary to determine patient infection status. Positive  results do not rule out bacterial infection or co-infection with other viruses.  The expected result is Negative.  Fact Sheet for Patients: 02/24/2020  Fact Sheet for Healthcare Providers: HairSlick.no  This test is not yet approved or cleared by the quierodirigir.com FDA and  has been authorized for detection and/or diagnosis of SARS-CoV-2 by FDA under an Emergency Use Authorization (EUA). This EUA will remain  in effect (meaning this test can be used) for the duration of the COVID-19 declarat ion under Section 564(b)(1) of the Act, 21 U.S.C. section 360bbb-3(b)(1), unless the authorization is terminated or revoked sooner.  Performed at [facility], [address], [place], [place] [address]   Culture, group Danyiel Crespin strep (throat)     Status: None   Collection Time: 02/22/20  1:33 PM   Specimen: Throat  Result Value Ref Range Status   Specimen Description THROAT  Final   Special Requests NONE  Final   Culture   Final    NO GROUP Tramaine Snell STREP (S.PYOGENES) ISOLATED Performed at [facility], [address], [place], [place] [address]    Report Status 02/25/2020 FINAL  Final         Radiology Studies: CT ANGIO CHEST PE W OR WO CONTRAST  Result Date: 03/01/2020 CLINICAL DATA:  29 year old male with recent cough and sore throat. High probability of pulmonary embolus. Respiratory failure due to COVID-19. EXAM: CT ANGIOGRAPHY CHEST WITH CONTRAST TECHNIQUE: Multidetector CT imaging of the chest was performed using the standard protocol during bolus administration of intravenous contrast. Multiplanar CT image reconstructions and MIPs were obtained to evaluate the vascular anatomy. CONTRAST:  27mL OMNIPAQUE IOHEXOL 300 MG/ML  SOLN COMPARISON:  Portable chest 02/27/2020. FINDINGS: Cardiovascular: Good contrast bolus timing in the pulmonary arterial tree. No focal filling defect identified in the pulmonary arteries to suggest  acute pulmonary embolism. Negative visible aorta, 4 vessel arch configuration (the left vertebral artery arises directly from the arch, normal variant). No pericardial effusion. Mediastinum/Nodes: Negative.  No lymphadenopathy. Lungs/Pleura: Relatively low lung volumes. Diffuse bilateral lower lobe pulmonary ground-glass opacity, progressing to consolidation in both lower lobes. Early peribronchial consolidation also in the middle lobes. There is bilateral more peripheral and left central ground-glass in the upper lobes. The major airways remain patent.  No pleural effusion. Upper Abdomen: Negative visible liver, spleen, pancreas, adrenal glands, bowel and right renal upper pole. Musculoskeletal: Negative. Review of the MIP images confirms the above findings. IMPRESSION: 1. Negative for acute pulmonary embolus. 2. Bilateral COVID-19 pneumonia with severe lower lobe involvement. No pleural effusion. Electronically Signed   By: Odessa Fleming M.D.   On: 03/01/2020 14:02        Scheduled Meds: . vitamin C  500 mg Oral Daily  . [START ON 03/03/2020] enoxaparin (LOVENOX) injection  50 mg Subcutaneous Q24H  . methylPREDNISolone (SOLU-MEDROL) injection  0.5 mg/kg Intravenous Q12H  . zinc sulfate  220 mg Oral Daily   Continuous Infusions:   LOS: 4 days    Time spent: over 30 min    Lacretia Nicks, MD [facility]   To contact the attending provider between 7A-7P or the covering provider during after hours 7P-7A, please log into the web site [email or web address] and access using universal [place] password for that web site. If you do not have the password, please call the hospital operator.  03/02/2020, 4:54 PM

## 2020-03-03 ENCOUNTER — Inpatient Hospital Stay (HOSPITAL_COMMUNITY): Payer: HRSA Program

## 2020-03-03 DIAGNOSIS — R7989 Other specified abnormal findings of blood chemistry: Secondary | ICD-10-CM

## 2020-03-03 DIAGNOSIS — U071 COVID-19: Secondary | ICD-10-CM

## 2020-03-03 LAB — COMPREHENSIVE METABOLIC PANEL
ALT: 35 U/L (ref 0–44)
AST: 20 U/L (ref 15–41)
Albumin: 3 g/dL — ABNORMAL LOW (ref 3.5–5.0)
Alkaline Phosphatase: 36 U/L — ABNORMAL LOW (ref 38–126)
Anion gap: 11 (ref 5–15)
BUN: 21 mg/dL — ABNORMAL HIGH (ref 6–20)
CO2: 26 mmol/L (ref 22–32)
Calcium: 8.7 mg/dL — ABNORMAL LOW (ref 8.9–10.3)
Chloride: 98 mmol/L (ref 98–111)
Creatinine, Ser: 0.8 mg/dL (ref 0.61–1.24)
GFR calc Af Amer: >60 mL/min (ref >60)
GFR calc non Af Amer: >60 mL/min (ref >60)
Glucose, Bld: 116 mg/dL — ABNORMAL HIGH (ref 70–99)
Potassium: 4.5 mmol/L (ref 3.5–5.1)
Sodium: 135 mmol/L (ref 135–145)
Total Bilirubin: 0.8 mg/dL (ref 0.3–1.2)
Total Protein: 7.5 g/dL (ref 6.5–8.1)

## 2020-03-03 LAB — CBC WITH DIFFERENTIAL/PLATELET
Abs Immature Granulocytes: 0.16 10*3/uL — ABNORMAL HIGH (ref 0.00–0.07)
Basophils Absolute: 0 10*3/uL (ref 0.0–0.1)
Basophils Relative: 0 %
Eosinophils Absolute: 0 10*3/uL (ref 0.0–0.5)
Eosinophils Relative: 0 %
HCT: 45.6 % (ref 39.0–52.0)
Hemoglobin: 14.5 g/dL (ref 13.0–17.0)
Immature Granulocytes: 1 %
Lymphocytes Relative: 11 %
Lymphs Abs: 1.4 10*3/uL (ref 0.7–4.0)
MCH: 27 pg (ref 26.0–34.0)
MCHC: 31.8 g/dL (ref 30.0–36.0)
MCV: 84.9 fL (ref 80.0–100.0)
Monocytes Absolute: 0.5 10*3/uL (ref 0.1–1.0)
Monocytes Relative: 4 %
Neutro Abs: 10.9 10*3/uL — ABNORMAL HIGH (ref 1.7–7.7)
Neutrophils Relative %: 84 %
Platelets: 430 10*3/uL — ABNORMAL HIGH (ref 150–400)
RBC: 5.37 MIL/uL (ref 4.22–5.81)
RDW: 13.7 % (ref 11.5–15.5)
WBC: 12.9 10*3/uL — ABNORMAL HIGH (ref 4.0–10.5)
nRBC: 0 % (ref 0.0–0.2)

## 2020-03-03 LAB — D-DIMER, QUANTITATIVE: D-Dimer, Quant: 1.74 ug/mL-FEU — ABNORMAL HIGH (ref 0.00–0.50)

## 2020-03-03 LAB — MAGNESIUM: Magnesium: 2.3 mg/dL (ref 1.7–2.4)

## 2020-03-03 LAB — PHOSPHORUS: Phosphorus: 3.7 mg/dL (ref 2.5–4.6)

## 2020-03-03 LAB — FERRITIN: Ferritin: 334 ng/mL (ref 24–336)

## 2020-03-03 MED ORDER — DEXAMETHASONE 6 MG PO TABS: 6.0000 mg | ORAL_TABLET | Freq: Two times a day (BID) | ORAL | 0 refills | Status: DC

## 2020-03-03 MED ORDER — DEXAMETHASONE 6 MG PO TABS: 6.0000 mg | ORAL_TABLET | Freq: Every day | ORAL | 0 refills | Status: AC

## 2020-03-03 NOTE — Progress Notes (Signed)
Adrian Thomas to be D/C'd Home per MD order.  Discussed with the patient and all questions fully answered.  VSS, Skin clean, dry and intact without evidence of skin break down, no evidence of skin tears noted. IV catheter discontinued intact. Site without signs and symptoms of complications. Dressing and pressure applied.  An After Visit Summary was printed and given to the patient. Patient received prescription.  D/c education completed with patient/family including follow up instructions, medication list, d/c activities limitations if indicated, with other d/c instructions as indicated by MD - patient able to verbalize understanding, all questions fully answered.   Patient instructed to return to ED, call 911, or call MD for any changes in condition.   Patient escorted via WC, and D/C home via private auto.  [name] 03/03/2020 5:10 PM

## 2020-03-03 NOTE — Discharge Instructions (Signed)
Person Under Monitoring Name: Adrian Thomas  Location: [address] [place] [address]   Infection Prevention Recommendations for Individuals Confirmed to have, or Being Evaluated for, 2019 Novel Coronavirus (COVID-19) Infection Who Receive Care at Home  Individuals who are confirmed to have, or are being evaluated for, COVID-19 should follow the prevention steps below until Adrian Thomas healthcare provider or local or state health department says they can return to normal activities.  Stay home except to get medical care You should restrict activities outside your home, except for getting medical care. Do not go to work, school, or public areas, and do not use public transportation or taxis.  Call ahead before visiting your doctor Before your medical appointment, call the healthcare provider and tell them that you have, or are being evaluated for, COVID-19 infection. This will help the healthcare provider's office take steps to keep other people from getting infected. Ask your healthcare provider to call the local or state health department.  Monitor your symptoms Seek prompt medical attention if your illness is worsening (e.g., difficulty breathing). Before going to your medical appointment, call the healthcare provider and tell them that you have, or are being evaluated for, COVID-19 infection. Ask your healthcare provider to call the local or state health department.  Wear Adrian Thomas facemask You should wear Adrian Thomas facemask that covers your nose and mouth when you are in the same room with other people and when you visit Adrian Thomas healthcare provider. People who live with or visit you should also wear Adrian Thomas facemask while they are in the same room with you.  Separate yourself from other people in your home As much as possible, you should stay in Adrian Thomas different room from other people in your home. Also, you should use Adrian Thomas separate bathroom, if available.  Avoid sharing household items You should not share  dishes, drinking glasses, cups, eating utensils, towels, bedding, or other items with other people in your home. After using these items, you should wash them thoroughly with soap and water.  Cover your coughs and sneezes Cover your mouth and nose with Adrian Thomas tissue when you cough or sneeze, or you can cough or sneeze into your sleeve. Throw used tissues in Adrian Thomas lined trash can, and immediately wash your hands with soap and water for at least 20 seconds or use an alcohol-based hand rub.  Wash your [employer] your hands often and thoroughly with soap and water for at least 20 seconds. You can use an alcohol-based hand sanitizer if soap and water are not available and if your hands are not visibly dirty. Avoid touching your eyes, nose, and mouth with unwashed hands.   Prevention Steps for Caregivers and Household Members of Individuals Confirmed to have, or Being Evaluated for, COVID-19 Infection Being Cared for in the Home  If you live with, or provide care at home for, Adrian Thomas person confirmed to have, or being evaluated for, COVID-19 infection please follow these guidelines to prevent infection:  Follow healthcare provider's instructions Make sure that you understand and can help the patient follow any healthcare provider instructions for all care.  Provide for the patient's basic needs You should help the patient with basic needs in the home and provide support for getting groceries, prescriptions, and other personal needs.  Monitor the patient's symptoms If they are getting sicker, call his or her medical provider and tell them that the patient has, or is being evaluated for, COVID-19 infection. This will help the healthcare provider's office take  steps to keep other people from getting infected. Ask the healthcare provider to call the local or state health department.  Limit the number of people who have contact with the patient  If possible, have only one caregiver for the patient.  Other  household members should stay in another home or place of residence. If this is not possible, they should stay  in another room, or be separated from the patient as much as possible. Use Adrian Thomas separate bathroom, if available.  Restrict visitors who do not have an essential need to be in the home.  Keep older adults, very young children, and other sick people away from the patient Keep older adults, very young children, and those who have compromised immune systems or chronic health conditions away from the patient. This includes people with chronic heart, lung, or kidney conditions, diabetes, and cancer.  Ensure good ventilation Make sure that shared spaces in the home have good air flow, such as from an air conditioner or an opened window, weather permitting.  Wash your hands often  Wash your hands often and thoroughly with soap and water for at least 20 seconds. You can use an alcohol based hand sanitizer if soap and water are not available and if your hands are not visibly dirty.  Avoid touching your eyes, nose, and mouth with unwashed hands.  Use disposable paper towels to dry your hands. If not available, use dedicated cloth towels and replace them when they become wet.  Wear Adrian Thomas facemask and gloves  Wear Adrian Thomas disposable facemask at all times in the room and gloves when you touch or have contact with the patient's blood, body fluids, and/or secretions or excretions, such as sweat, saliva, sputum, nasal mucus, vomit, urine, or feces.  Ensure the mask fits over your nose and mouth tightly, and do not touch it during use.  Throw out disposable facemasks and gloves after using them. Do not reuse.  Wash your hands immediately after removing your facemask and gloves.  If your personal clothing becomes contaminated, carefully remove clothing and launder. Wash your hands after handling contaminated clothing.  Place all used disposable facemasks, gloves, and other waste in Adrian Thomas lined container before  disposing them with other household waste.  Remove gloves and wash your hands immediately after handling these items.  Do not share dishes, glasses, or other household items with the patient  Avoid sharing household items. You should not share dishes, drinking glasses, cups, eating utensils, towels, bedding, or other items with Adrian Thomas patient who is confirmed to have, or being evaluated for, COVID-19 infection.  After the person uses these items, you should wash them thoroughly with soap and water.  Wash laundry thoroughly  Immediately remove and wash clothes or bedding that have blood, body fluids, and/or secretions or excretions, such as sweat, saliva, sputum, nasal mucus, vomit, urine, or feces, on them.  Wear gloves when handling laundry from the patient.  Read and follow directions on labels of laundry or clothing items and detergent. In general, wash and dry with the warmest temperatures recommended on the label.  Clean all areas the individual has used often  Clean all touchable surfaces, such as counters, tabletops, doorknobs, bathroom fixtures, toilets, phones, keyboards, tablets, and bedside tables, every day. Also, clean any surfaces that may have blood, body fluids, and/or secretions or excretions on them.  Wear gloves when cleaning surfaces the patient has come in contact with.  Use Adrian Thomas diluted bleach solution (e.g., dilute bleach with 1 part bleach  and 10 parts water) or Adrian Thomas household disinfectant with [employer] label that says EPA-registered for coronaviruses. To make Briyanna Billingham bleach solution at home, add 1 tablespoon of bleach to 1 quart (4 cups) of water. For Damire Remedios larger supply, add  cup of bleach to 1 gallon (16 cups) of water.  Read labels of cleaning products and follow recommendations provided on product labels. Labels contain instructions for safe and effective use of the cleaning product including precautions you should take when applying the product, such as wearing gloves or eye protection  and making sure you have good ventilation during use of the product.  Remove gloves and wash hands immediately after cleaning.  Monitor yourself for signs and symptoms of illness Caregivers and household members are considered close contacts, should monitor their health, and will be asked to limit movement outside of the home to the extent possible. Follow the monitoring steps for close contacts listed on the symptom monitoring form.   ? If you have additional questions, contact your local health department or call the epidemiologist on call at [phone] (available 24/7). ? This guidance is subject to change. For the most up-to-date guidance from [facility], please refer to their website: TripMetro.hu

## 2020-03-03 NOTE — TOC Transition Note (Signed)
Transition of Care [facility]) - CM/SW Discharge Note   Patient Details  Name: Adrian Thomas MRN: [id number] Date of Birth: [birth year]  [facility]) CM/SW Contact:  Lockie Pares, RN Phone Number: 03/03/2020, 4:22 PM   Clinical Narrative:    Patient is discharging needs oxygen at 2L  [employer] notified, he is a self- pay. Let patient know the cost is around 125.00 a month.  Patient has orders , patient needs no qualifiers as he is self pay.    Final next level of care: Home/Self Care Barriers to Discharge: No Barriers Identified   Patient Goals and CMS Choice Patient states their goals for this hospitalization and ongoing recovery are:: go home      Discharge Placement                       Discharge Plan and Services                DME Arranged: Oxygen DME Agency: Other - Comment Date DME Agency Contacted: 03/03/20 Time DME Agency Contacted: [phone] Representative spoke with at DME Agency: Vonna Kotyk            Social Determinants of Health (SDOH) Interventions     Readmission Risk Interventions No flowsheet data found.

## 2020-03-03 NOTE — Progress Notes (Signed)
Lower ext. study completed.   See CVProc for preliminary results.   Lanson Randle, RDMS, RVT 

## 2020-03-03 NOTE — Discharge Summary (Signed)
Physician Discharge Summary  Mylz Yuan YCX:[id number] DOB: [birth year] DOA: 02/26/2020  PCP: Patient, No Pcp Per  Admit date: 02/26/2020 Discharge date: 03/03/2020  Time spent: 40 minutes  Recommendations for Outpatient Follow-up:  1. Follow outpatient CBC/CMP 2. Follow oxygen needs outpatient - discharged with 2 L to use with activity and at night 3. Follow sleep study outpatient  Discharge Diagnoses:  Principal Problem:   Acute respiratory failure due to COVID-19 [facility]) Active Problems:   Acute respiratory disease due to COVID-19 virus  Discharge Condition: stable  Diet recommendation: heart healthy  Filed Weights   02/27/20 0144  Weight: 95.3 kg    History of present illness:  29 year old male with no significant history presented to the ER with shortness of breath and nonproductive cough. Initial symptoms started on 8/14. Diagnosed with COVID-19 infection on 8/16. Trying to manage at home but came to ER with worsening shortness of breath. In the emergency room, temperature 100.4, requiring 3 L of oxygen. Chest x-ray with bilateral infiltrates. Given IV remdesivir and IV Solu-Medrol. 8/22: Patient had Dineen Conradt brief episode of unresponsiveness in the morning at the ER. No neurological deficit. Thought to be hypoxia or sleepiness.  He was admitted with COVID 19 pneumonia.  He's improved with steroids and remdesivir.  He's desatting with ambulation, so will discharge home with oxygen with activity and sleep.    See below for additional details  Hospital Course:  Acute respiratory failure due to COVID-19 virus, pneumonia due to COVID-19 virus infection: CT scan 8/24 with bilateral covid 19 pneumonia On RA at rest today, desats with activity, discharge with supplemental O2 to use with activity  Discharge with steroids to complete 10 day course (completed remdesivir 8/25) Inflammatory labs relatively stable Strict I/O, daily weights Prone as able, OOB, IS, flutter  COVID-19  Labs  Recent Labs    03/02/20 0808 03/03/20 0152  DDIMER 2.39* 1.74*  FERRITIN 371* 334    Lab Results  Component Value Date   SARSCOV2NAA POSITIVE (Monika Chestang) 02/22/2020   Elevated D dimer: negative CT PE for PE on 8/24.  Negative LE [place] for DVT.   Unresponsiveness at Night: see note from 8/21 at 2333.  Pt found unresponsive with shallow breathing and seizure like activity.  Negative MRI brain and EEG.  Per previous provider, sounds like he awoke immediately without postictal period.   Discussed need for outpatient sleep study (he thinks this was related to sleep apnea)  Procedures: LE [place] Summary:  RIGHT:  - There is no evidence of deep vein thrombosis in the lower extremity.    - No cystic structure found in the popliteal fossa.    LEFT:  - There is no evidence of deep vein thrombosis in the lower extremity.    - No cystic structure found in the popliteal fossa.   Consultations:  none  Discharge Exam: Vitals:   03/02/20 2105 03/03/20 0510  BP: 131/80 123/73  Pulse: 95 67  Resp: 20 20  Temp: 98.8 F (37.1 C) 97.8 F (36.6 C)  SpO2: 90% (!) 74%   Feeling better Eager for discharge  General: No acute distress. Cardiovascular: Heart sounds show Iola Turri regular rate, and rhythm.  Lungs: Clear to auscultation bilaterally Abdomen: Soft, nontender, nondistended Neurological: Alert and oriented 3. Moves all extremities 4. Cranial nerves II through XII grossly intact. Skin: Warm and dry. No rashes or lesions. Extremities: No clubbing or cyanosis. No edema.  Discharge Instructions   Discharge Instructions    Call MD for:  difficulty  breathing, headache or visual disturbances   Complete by: As directed    Call MD for:  extreme fatigue   Complete by: As directed    Call MD for:  hives   Complete by: As directed    Call MD for:  persistant dizziness or light-headedness   Complete by: As directed    Call MD for:  persistant nausea and vomiting   Complete by: As  directed    Call MD for:  redness, tenderness, or signs of infection (pain, swelling, redness, odor or green/yellow discharge around incision site)   Complete by: As directed    Call MD for:  severe uncontrolled pain   Complete by: As directed    Call MD for:  temperature >100.4   Complete by: As directed    Diet - low sodium heart healthy   Complete by: As directed    Discharge instructions   Complete by: As directed    You were seen for COVID pneumonia.   You've improved with steroids and remdesivir.  We'll send you home with Paras Kreider few more days of steroids and oxygen to use with activity and at rest.    Continue to isolate until 03/14/2020.  After the 6th, you can discontinue your isolation if you've continued to improve without the use of fever reducing medicines.    You should follow up with your PCP to determine how long you'll need your oxygen.  You also need an evaluation outpatient for sleep apnea.    Return for new, recurrent, or worsening symptoms.  Please ask your PCP to request records from this hospitalization so they know what was done and what the next steps will be.   Increase activity slowly   Complete by: As directed      Allergies as of 03/03/2020   No Known Allergies     Medication List    STOP taking these medications   predniSONE 10 MG (21) Tbpk tablet Commonly known as: STERAPRED UNI-PAK 21 TAB     TAKE these medications   acetaminophen 325 MG tablet Commonly known as: TYLENOL Take 650 mg by mouth every 6 (six) hours as needed for mild pain or fever.   albuterol 108 (90 Base) MCG/ACT inhaler Commonly known as: VENTOLIN HFA Inhale 2 puffs into the lungs every 4 (four) hours as needed for wheezing or shortness of breath.   benzonatate 100 MG capsule Commonly known as: TESSALON Take 1 capsule (100 mg total) by mouth every 8 (eight) hours.   dexamethasone 6 MG tablet Commonly known as: DECADRON Take 1 tablet (6 mg total) by mouth daily for 4 days.    Fluticasone-Salmeterol 100-50 MCG/DOSE Aepb Commonly known as: Advair Diskus Inhale 1 puff into the lungs 2 (two) times daily.            Durable Medical Equipment  (From admission, onward)         Start     Ordered   03/03/20 1540  DME Oxygen  Once       Comments: SATURATION QUALIFICATIONS: (This note is used to comply with regulatory documentation for home oxygen)  Patient Saturations on Room Air at Rest = 91%  Patient Saturations on Room Air while Ambulating = 84%  Patient Saturations on 2 Liters of oxygen while Ambulating = 91 %  Please briefly explain why patient needs home oxygen: pts oxygen dropped bellow 86% while ambulating pt would benefit from home o2 at this time.  Question Answer Comment  Length of  Need 12 Months   Mode or (Route) Nasal cannula   Liters per Minute 2   Frequency Continuous (stationary and portable oxygen unit needed)   Oxygen conserving device Yes   Oxygen delivery system Gas      03/03/20 1542         No Known Allergies  Follow-up Information    [facility]. Go on 03/24/2020.   Specialty: Family Medicine Why: 10:15 am Bring Javyn Havlin photo ID, please arrive 15 minutes prior to your appointment Contact information: [address] [place] [address] [phone]               The results of significant diagnostics from this hospitalization (including imaging, microbiology, ancillary and laboratory) are listed below for reference.    Significant Diagnostic Studies: CT HEAD WO CONTRAST  Result Date: 02/28/2020 CLINICAL DATA:  29 year old male with history of seizure. COVID-19. Abnormal neurologic examination. EXAM: CT HEAD WITHOUT CONTRAST TECHNIQUE: Contiguous axial images were obtained from the base of the skull through the vertex without intravenous contrast. COMPARISON:  Brain MRI 02/28/2020. FINDINGS: Brain: No evidence of acute infarction, hemorrhage, hydrocephalus, extra-axial collection or  mass lesion/mass effect. Vascular: No hyperdense vessel or unexpected calcification. Skull: Normal. Negative for fracture or focal lesion. Sinuses/Orbits: Multifocal mucosal thickening in the frontal, anterior ethmoid and right maxillary sinuses. No air-fluid levels. Other: None. IMPRESSION: 1. No acute intracranial abnormalities. The appearance of the brain is normal. 2. Paranasal sinus disease, as above, without acute findings. Electronically Signed   By: Trudie Reed M.D.   On: 02/28/2020 06:55   CT ANGIO CHEST PE W OR WO CONTRAST  Result Date: 03/01/2020 CLINICAL DATA:  29 year old male with recent cough and sore throat. High probability of pulmonary embolus. Respiratory failure due to COVID-19. EXAM: CT ANGIOGRAPHY CHEST WITH CONTRAST TECHNIQUE: Multidetector CT imaging of the chest was performed using the standard protocol during bolus administration of intravenous contrast. Multiplanar CT image reconstructions and MIPs were obtained to evaluate the vascular anatomy. CONTRAST:  72mL OMNIPAQUE IOHEXOL 300 MG/ML  SOLN COMPARISON:  Portable chest 02/27/2020. FINDINGS: Cardiovascular: Good contrast bolus timing in the pulmonary arterial tree. No focal filling defect identified in the pulmonary arteries to suggest acute pulmonary embolism. Negative visible aorta, 4 vessel arch configuration (the left vertebral artery arises directly from the arch, normal variant). No pericardial effusion. Mediastinum/Nodes: Negative.  No lymphadenopathy. Lungs/Pleura: Relatively low lung volumes. Diffuse bilateral lower lobe pulmonary ground-glass opacity, progressing to consolidation in both lower lobes. Early peribronchial consolidation also in the middle lobes. There is bilateral more peripheral and left central ground-glass in the upper lobes. The major airways remain patent.  No pleural effusion. Upper Abdomen: Negative visible liver, spleen, pancreas, adrenal glands, bowel and right renal upper pole. Musculoskeletal:  Negative. Review of the MIP images confirms the above findings. IMPRESSION: 1. Negative for acute pulmonary embolus. 2. Bilateral COVID-19 pneumonia with severe lower lobe involvement. No pleural effusion. Electronically Signed   By: Odessa Fleming M.D.   On: 03/01/2020 14:02   MR BRAIN WO CONTRAST  Result Date: 02/28/2020 CLINICAL DATA:  Seizure EXAM: MRI HEAD WITHOUT CONTRAST TECHNIQUE: Multiplanar, multiecho pulse sequences of the brain and surrounding structures were obtained without intravenous contrast. COMPARISON:  None. FINDINGS: BRAIN: No acute infarct, acute hemorrhage or extra-axial collection. Normal white matter signal. Normal volume of CSF spaces. No chronic microhemorrhage. Normal midline structures. The hippocampi are normal and symmetric in size and signal. The hypothalamus and mamillary bodies are normal.  There is no cortical ectopia or dysplasia. VASCULAR: Major flow voids are preserved. SKULL AND UPPER CERVICAL SPINE: Normal calvarium and skull base. Visualized upper cervical spine and soft tissues are normal. SINUSES/ORBITS: Frontal sinus mucosal thickening.  Normal orbits. IMPRESSION: Normal brain MRI. Electronically Signed   By: Deatra Robinson M.D.   On: 02/28/2020 01:04   DG Chest Portable 1 View  Result Date: 02/27/2020 CLINICAL DATA:  Cough and sore throat EXAM: PORTABLE CHEST 1 VIEW COMPARISON:  06/10/2014 FINDINGS: Shallow lung inflation with bibasilar opacities. No pleural effusion or pneumothorax. Normal cardiomediastinal contours. IMPRESSION: Shallow lung inflation with bibasilar opacities, possibly atelectasis or infection. Electronically Signed   By: Deatra Robinson M.D.   On: 02/27/2020 00:58   EEG adult  Result Date: 02/29/2020 Charlsie Quest, MD     02/29/2020  4:44 PM Patient Name: Wilfredo Canterbury MRN: [id number] Epilepsy Attending: Charlsie Quest Referring Physician/Provider: Dr Trey Paula Date: 02/29/2020 Duration: 28.14 mins Patient history: 29yo M with transient  alteration of awareness at night. EEG to evaluate for seizure. Level of alertness: Awake, asleep AEDs during EEG study: None Technical aspects: This EEG study was done with scalp electrodes positioned according to the 10-20 International system of electrode placement. Electrical activity was acquired at Trayce Maino sampling rate of 500Hz  and reviewed with Traven Davids high frequency filter of 70Hz  and Lyne Khurana low frequency filter of 1Hz . EEG data were recorded continuously and digitally stored. Description: The posterior dominant rhythm consists of 9-10 Hz activity of moderate voltage (25-35 uV) seen predominantly in posterior head regions, symmetric and reactive to eye opening and eye closing. Sleep was characterized by vertex waves, sleep spindles (12 to 14 Hz), maximal frontocentral region. Physiologic photic driving was seen during photic stimulation. Hyperventilation was not performed.   IMPRESSION: This study is within normal limits. No seizures or epileptiform discharges were seen throughout the recording. Priyanka O Yadav   VAS LOWER EXTREMITY VENOUS (DVT)  Result Date: 03/03/2020  Lower Venous DVTStudy Indications: Elevated D dimer , Covid +.  Performing Technologist: RCT RDMS  Examination Guidelines: Treyvone Chelf complete evaluation includes B-mode imaging, spectral Doppler, color Doppler, and power Doppler as needed of all accessible portions of each vessel. Bilateral testing is considered an integral part of Jonell Brumbaugh complete examination. Limited examinations for reoccurring indications may be performed as noted. The reflux portion of the exam is performed with the patient in reverse Trendelenburg.  +---------+---------------+---------+-----------+----------+--------------+ RIGHT    CompressibilityPhasicitySpontaneityPropertiesThrombus Aging +---------+---------------+---------+-----------+----------+--------------+ CFV      Full           Yes      Yes                                  +---------+---------------+---------+-----------+----------+--------------+ SFJ      Full                                                        +---------+---------------+---------+-----------+----------+--------------+ FV Prox  Full                                                        +---------+---------------+---------+-----------+----------+--------------+  FV Mid   Full                                                        +---------+---------------+---------+-----------+----------+--------------+ FV DistalFull                                                        +---------+---------------+---------+-----------+----------+--------------+ PFV      Full                                                        +---------+---------------+---------+-----------+----------+--------------+ POP      Full           Yes      Yes                                 +---------+---------------+---------+-----------+----------+--------------+ PTV      Full                                                        +---------+---------------+---------+-----------+----------+--------------+ PERO     Full                                                        +---------+---------------+---------+-----------+----------+--------------+   +---------+---------------+---------+-----------+----------+--------------+ LEFT     CompressibilityPhasicitySpontaneityPropertiesThrombus Aging +---------+---------------+---------+-----------+----------+--------------+ CFV      Full           Yes      Yes                                 +---------+---------------+---------+-----------+----------+--------------+ SFJ      Full                                                        +---------+---------------+---------+-----------+----------+--------------+ FV Prox  Full                                                         +---------+---------------+---------+-----------+----------+--------------+ FV Mid   Full                                                        +---------+---------------+---------+-----------+----------+--------------+  FV DistalFull                                                        +---------+---------------+---------+-----------+----------+--------------+ PFV      Full                                                        +---------+---------------+---------+-----------+----------+--------------+ POP      Full           Yes      Yes                                 +---------+---------------+---------+-----------+----------+--------------+ PTV      Full                                                        +---------+---------------+---------+-----------+----------+--------------+ PERO     Full                                                        +---------+---------------+---------+-----------+----------+--------------+     Summary: RIGHT: - There is no evidence of deep vein thrombosis in the lower extremity.  - No cystic structure found in the popliteal fossa.  LEFT: - There is no evidence of deep vein thrombosis in the lower extremity.  - No cystic structure found in the popliteal fossa.  *See table(s) above for measurements and observations. Electronically signed by Sherald Hesshristopher Clark MD on 03/03/2020 at 3:23:23 PM.    Final     Microbiology: No results found for this or any previous visit (from the past 240 hour(s)).   Labs: Basic Metabolic Panel: Recent Labs  Lab 02/27/20 0017 02/28/20 0836 02/29/20 0741 03/02/20 0808 03/03/20 0152  NA 130* 134* 136 135 135  K 4.8 4.6 4.7 4.8 4.5  CL 95* 96* 98 98 98  CO2 23 25 26 26 26   GLUCOSE 126* 128* 127* 114* 116*  BUN 21* 22* 23* 22* 21*  CREATININE 1.05 0.79 0.82 0.87 0.80  CALCIUM 8.5* 8.8* 8.9 8.9 8.7*  MG  --   --  2.4 2.2 2.3  PHOS  --   --  4.1 3.5 3.7   Liver Function Tests: Recent Labs   Lab 02/27/20 0017 02/28/20 0836 02/29/20 0741 03/02/20 0808 03/03/20 0152  AST 45* 38 27 21 20   ALT 32 47* 47* 34 35  ALKPHOS 37* 37* 34* 38 36*  BILITOT 0.8 0.7 0.7 1.0 0.8  PROT 8.3* 7.8 7.6 7.6 7.5  ALBUMIN 3.2* 2.8* 2.9* 3.1* 3.0*   No results for input(s): LIPASE, AMYLASE in the last 168 hours. No results for input(s): AMMONIA in the last 168 hours. CBC: Recent Labs  Lab 02/27/20 0017 02/28/20 0836 02/29/20 0741 03/02/20 0808 03/03/20 0152  WBC 12.7*  12.5* 16.4* 17.0* 12.9*  NEUTROABS 11.1* 11.2* 15.0* 15.4* 10.9*  HGB 15.0 14.1 14.5 15.0 14.5  HCT 45.7 44.3 44.0 47.5 45.6  MCV 84.9 83.7 84.8 85.6 84.9  PLT 222 270 381 435* 430*   Cardiac Enzymes: No results for input(s): CKTOTAL, CKMB, CKMBINDEX, TROPONINI in the last 168 hours. BNP: BNP (last 3 results) No results for input(s): BNP in the last 8760 hours.  ProBNP (last 3 results) No results for input(s): PROBNP in the last 8760 hours.  CBG: No results for input(s): GLUCAP in the last 168 hours.     Signed:  Lacretia Nicks MD.  [facility] 03/03/2020, 3:55 PM

## 2020-03-03 NOTE — Progress Notes (Signed)
SATURATION QUALIFICATIONS: (This note is used to comply with regulatory documentation for home oxygen)  Patient Saturations on Room Air at Rest = 91%  Patient Saturations on Room Air while Ambulating = 84%  Patient Saturations on 2 Liters of oxygen while Ambulating = 91 %  Please briefly explain why patient needs home oxygen: pts oxygen dropped bellow 86% while ambulating pt would benefit from home o2 at this time.

## 2020-03-13 ENCOUNTER — Other Ambulatory Visit: Payer: Self-pay

## 2020-03-13 ENCOUNTER — Ambulatory Visit (INDEPENDENT_AMBULATORY_CARE_PROVIDER_SITE_OTHER): Payer: HRSA Program

## 2020-03-13 ENCOUNTER — Ambulatory Visit (HOSPITAL_COMMUNITY)
Admission: EM | Admit: 2020-03-13 | Discharge: 2020-03-13 | Disposition: A | Discharge status: Home / Self Care | Payer: HRSA Program | Attending: Family Medicine | Admitting: Family Medicine

## 2020-03-13 ENCOUNTER — Encounter (HOSPITAL_COMMUNITY): Payer: Self-pay

## 2020-03-13 DIAGNOSIS — R5383 Other fatigue: Secondary | ICD-10-CM | POA: Insufficient documentation

## 2020-03-13 DIAGNOSIS — U071 COVID-19: Secondary | ICD-10-CM | POA: Insufficient documentation

## 2020-03-13 DIAGNOSIS — J129 Viral pneumonia, unspecified: Secondary | ICD-10-CM | POA: Diagnosis present

## 2020-03-13 DIAGNOSIS — R0682 Tachypnea, not elsewhere classified: Secondary | ICD-10-CM | POA: Insufficient documentation

## 2020-03-13 DIAGNOSIS — R0602 Shortness of breath: Secondary | ICD-10-CM

## 2020-03-13 DIAGNOSIS — R05 Cough: Secondary | ICD-10-CM | POA: Diagnosis not present

## 2020-03-13 DIAGNOSIS — R000 Tachycardia, unspecified: Secondary | ICD-10-CM | POA: Insufficient documentation

## 2020-03-13 LAB — COMPREHENSIVE METABOLIC PANEL
ALT: 24 U/L (ref 0–44)
AST: 19 U/L (ref 15–41)
Albumin: 3.3 g/dL — ABNORMAL LOW (ref 3.5–5.0)
Alkaline Phosphatase: 63 U/L (ref 38–126)
Anion gap: 10 (ref 5–15)
BUN: 10 mg/dL (ref 6–20)
CO2: 28 mmol/L (ref 22–32)
Calcium: 9.3 mg/dL (ref 8.9–10.3)
Chloride: 99 mmol/L (ref 98–111)
Creatinine, Ser: 0.8 mg/dL (ref 0.61–1.24)
GFR calc Af Amer: >60 mL/min (ref >60)
GFR calc non Af Amer: >60 mL/min (ref >60)
Glucose, Bld: 94 mg/dL (ref 70–99)
Potassium: 4.1 mmol/L (ref 3.5–5.1)
Sodium: 137 mmol/L (ref 135–145)
Total Bilirubin: 1.2 mg/dL (ref 0.3–1.2)
Total Protein: 7.5 g/dL (ref 6.5–8.1)

## 2020-03-13 LAB — CBC WITH DIFFERENTIAL/PLATELET
Abs Immature Granulocytes: 0.04 10*3/uL (ref 0.00–0.07)
Basophils Absolute: 0 10*3/uL (ref 0.0–0.1)
Basophils Relative: 0 %
Eosinophils Absolute: 0.1 10*3/uL (ref 0.0–0.5)
Eosinophils Relative: 1 %
HCT: 43.5 % (ref 39.0–52.0)
Hemoglobin: 13.9 g/dL (ref 13.0–17.0)
Immature Granulocytes: 0 %
Lymphocytes Relative: 20 %
Lymphs Abs: 2 10*3/uL (ref 0.7–4.0)
MCH: 27.9 pg (ref 26.0–34.0)
MCHC: 32 g/dL (ref 30.0–36.0)
MCV: 87.3 fL (ref 80.0–100.0)
Monocytes Absolute: 0.7 10*3/uL (ref 0.1–1.0)
Monocytes Relative: 7 %
Neutro Abs: 7.3 10*3/uL (ref 1.7–7.7)
Neutrophils Relative %: 72 %
Platelets: 206 10*3/uL (ref 150–400)
RBC: 4.98 MIL/uL (ref 4.22–5.81)
RDW: 14.4 % (ref 11.5–15.5)
WBC: 10.2 10*3/uL (ref 4.0–10.5)
nRBC: 0 % (ref 0.0–0.2)

## 2020-03-13 LAB — D-DIMER, QUANTITATIVE: D-Dimer, Quant: 1.36 ug/mL-FEU — ABNORMAL HIGH (ref 0.00–0.50)

## 2020-03-13 LAB — C-REACTIVE PROTEIN: CRP: 10 mg/dL — ABNORMAL HIGH (ref <1.0)

## 2020-03-13 MED ORDER — METHYLPREDNISOLONE SODIUM SUCC 125 MG IJ SOLR
125.0000 mg | Freq: Once | INTRAMUSCULAR | Status: AC
  Administered 2020-03-13: 125 mg via INTRAMUSCULAR

## 2020-03-13 MED ORDER — AEROCHAMBER PLUS FLO-VU LARGE MISC
Status: AC
  Filled 2020-03-13: qty 1

## 2020-03-13 MED ORDER — METHYLPREDNISOLONE SODIUM SUCC 125 MG IJ SOLR
INTRAMUSCULAR | Status: AC
  Filled 2020-03-13: qty 2

## 2020-03-13 MED ORDER — AZITHROMYCIN 250 MG PO TABS: 250.0000 mg | ORAL_TABLET | Freq: Every day | ORAL | 0 refills | Status: DC

## 2020-03-13 MED ORDER — PULSE OXIMETER MISC: 1.0000 | Freq: Three times a day (TID) | 0 refills | Status: DC

## 2020-03-13 MED ORDER — AEROCHAMBER PLUS FLO-VU LARGE MISC
1.0000 | Freq: Once | Status: AC
  Administered 2020-03-13: 1

## 2020-03-13 MED ORDER — PREDNISONE 10 MG (21) PO TBPK: ORAL_TABLET | Freq: Every day | ORAL | 0 refills | Status: DC

## 2020-03-13 NOTE — ED Notes (Signed)
Pt reports improvement of SOB after using inhaler/spacer

## 2020-03-13 NOTE — Discharge Instructions (Addendum)
I have sent in a steroid taper for you to take as well. Take 6 tablets for the first two days, take 5 tablets for day three and four, take 4 tablets for days five and six, take 3 tablets for days seven and eight, take 2 tablets for day nine and ten, then take 1 tablet for days eleven and twelve.  Prescribed azithromycin. Take 2 tablets today and one tablet per day for the next 4 days  Take it easy and listen to your body  Check your oxygen saturation 3 times per day and as needed  May increase oxygen at home up to 4 liters per minute, if you need more than this, go to the ER  Follow up with this office or with primary care as needed

## 2020-03-13 NOTE — ED Triage Notes (Signed)
Pt c/o SOB, cough and tingling to feet/hands since being discharged from the hospital. Pt became SOBOE walking to room, tachypneic with decreased lung sounds bilaterally. Nailbeds slightly cyanotic. Pt has difficulty speaking full sentences with coughing. After a period of rest, able to speak longer phrases with improvement of WOB.  Denies fever, n/v/d.  Demonstrated to pt how to properly use Advair diskus.

## 2020-03-15 NOTE — ED Provider Notes (Addendum)
[facility]    CSN: [id number] Arrival date & time: 03/13/20  1538      History   Chief Complaint Chief Complaint  Patient presents with  . Shortness of Breath    HPI Adrian FilbertJustin Thomas is a 29 y.o. male.   Reports SOB, cough, fatigue, weakness for the last week. Reports that he is using 2 LPM O2 via nasal cannula at home. Upon chart review, he has recently been discharged from the hospital. He was admitted for respiratory failure with hypoxia from Covid pneumonia. Currently taking albuterol and advair inhalers. Using tessalon perles for cough. Reports that he is feeling better than he was when he was in the hospital. Expresses concern about when he may be able to go back to work. Symptoms are worse with activity. Rest and medications alleviate the symptoms partially. Denies sore throat, headache, fever, nausea, vomiting, diarrhea, rash, other symptoms.   ROS per HPI  The history is provided by the patient.  Shortness of Breath   History reviewed. No pertinent past medical history.  Patient Active Problem List   Diagnosis Date Noted  . Acute respiratory failure due to COVID-19 (HCC) 02/27/2020  . Acute respiratory disease due to COVID-19 virus 02/27/2020    History reviewed. No pertinent surgical history.     Home Medications    Prior to Admission medications   Medication Sig Start Date End Date Taking? Authorizing Provider  albuterol (VENTOLIN HFA) 108 (90 Base) MCG/ACT inhaler Inhale 2 puffs into the lungs every 4 (four) hours as needed for wheezing or shortness of breath. 02/22/20  Yes Moshe CiproMatthews, Kathrynn Backstrom, NP  Fluticasone-Salmeterol (ADVAIR DISKUS) 100-50 MCG/DOSE AEPB Inhale 1 puff into the lungs 2 (two) times daily. 02/22/20  Yes Moshe CiproMatthews, Danie Diehl, NP  acetaminophen (TYLENOL) 325 MG tablet Take 650 mg by mouth every 6 (six) hours as needed for mild pain or fever.    [provider]  azithromycin (ZITHROMAX) 250 MG tablet Take 1 tablet (250 mg total) by  mouth daily. Take first 2 tablets together, then 1 every day until finished. 03/13/20   Moshe CiproMatthews, Mahkai Fangman, NP  benzonatate (TESSALON) 100 MG capsule Take 1 capsule (100 mg total) by mouth every 8 (eight) hours. 02/22/20   Moshe CiproMatthews, Cela Newcom, NP  Misc. Devices (PULSE OXIMETER) MISC 1 Device by Does not apply route 3 (three) times daily. 03/13/20   Moshe CiproMatthews, Marquon Alcala, NP  predniSONE (STERAPRED UNI-PAK 21 TAB) 10 MG (21) TBPK tablet Take by mouth daily. Take 6 tabs by mouth daily  for 2 days, then 5 tabs for 2 days, then 4 tabs for 2 days, then 3 tabs for 2 days, 2 tabs for 2 days, then 1 tab by mouth daily for 2 days 03/13/20   Moshe CiproMatthews, Linsy Ehresman, NP    Family History Family History  Problem Relation Age of Onset  . Diabetes Mellitus II Neg Hx     Social History Social History   Tobacco Use  . Smoking status: Never Smoker  . Smokeless tobacco: Never Used  Substance Use Topics  . Alcohol use: No  . Drug use: No     Allergies   Patient has no known allergies.   Review of Systems Review of Systems  Respiratory: Positive for shortness of breath.      Physical Exam Triage Vital Signs ED Triage Vitals  Enc Vitals Group     BP 03/13/20 1727 125/85     Pulse Rate 03/13/20 1727 (!) 112     Resp 03/13/20 1727 (!) 28  Temp 03/13/20 1727 99.5 F (37.5 C)     Temp Source 03/13/20 1727 Oral     SpO2 03/13/20 1727 94 %     Weight --      Height --      Head Circumference --      Peak Flow --      Pain Score 03/13/20 1839 0     Pain Loc --      Pain Edu? --      Excl. in GC? --    No data found.  Updated Vital Signs BP 125/85 (BP Location: Left Arm)   Pulse (!) 112   Temp 99.5 F (37.5 C) (Oral)   Resp (!) 28   SpO2 94%   Visual Acuity Right Eye Distance:   Left Eye Distance:   Bilateral Distance:    Right Eye Near:   Left Eye Near:    Bilateral Near:     Physical Exam Vitals and nursing note reviewed.  Constitutional:      General: He is not in acute  distress.    Appearance: He is well-developed.  HENT:     Head: Normocephalic and atraumatic.     Nose: Nose normal.     Mouth/Throat:     Mouth: Mucous membranes are moist.     Pharynx: Oropharynx is clear.  Eyes:     Extraocular Movements: Extraocular movements intact.     Conjunctiva/sclera: Conjunctivae normal.     Pupils: Pupils are equal, round, and reactive to light.  Cardiovascular:     Rate and Rhythm: Regular rhythm. Tachycardia present.     Heart sounds: Normal heart sounds. No murmur heard.   Pulmonary:     Effort: Pulmonary effort is normal. Tachypnea present. No bradypnea or respiratory distress.     Breath sounds: Examination of the right-upper field reveals wheezing. Examination of the left-upper field reveals wheezing. Examination of the right-middle field reveals decreased breath sounds. Examination of the left-middle field reveals decreased breath sounds. Examination of the right-lower field reveals decreased breath sounds. Examination of the left-lower field reveals decreased breath sounds. Decreased breath sounds and wheezing present.  Abdominal:     General: Bowel sounds are normal.     Palpations: Abdomen is soft.     Tenderness: There is no abdominal tenderness.  Musculoskeletal:        General: Normal range of motion.     Cervical back: Normal range of motion and neck supple.  Skin:    General: Skin is warm and dry.     Capillary Refill: Capillary refill takes less than 2 seconds.  Neurological:     General: No focal deficit present.     Mental Status: He is alert and oriented to person, place, and time.  Psychiatric:        Mood and Affect: Mood normal.        Behavior: Behavior normal.        Thought Content: Thought content normal.        Judgment: Judgment normal.      UC Treatments / Results  Labs (all labs ordered are listed, but only abnormal results are displayed) Labs Reviewed  COMPREHENSIVE METABOLIC PANEL - Abnormal; Notable for the  following components:      Result Value   Albumin 3.3 (*)    All other components within normal limits  C-REACTIVE PROTEIN - Abnormal; Notable for the following components:   CRP 10.0 (*)    All other components within normal  limits  D-DIMER, QUANTITATIVE (NOT AT [facility]) - Abnormal; Notable for the following components:   D-Dimer, Quant 1.36 (*)    All other components within normal limits  CBC WITH DIFFERENTIAL/PLATELET    EKG   Radiology DG Chest 2 View  Result Date: 03/13/2020 CLINICAL DATA:  Shortness of breath, dry cough. COVID positive on February 22, 2020. EXAM: CHEST - 2 VIEW COMPARISON:  Chest x-ray dated 02/27/2020. Chest CT dated 03/01/2020. FINDINGS: Persistent bibasilar airspace opacities, not significantly changed on the RIGHT, perhaps slightly improved aeration at the LEFT lung base. No pleural effusion or pneumothorax is seen. Heart size and mediastinal contours are stable. Osseous structures about the chest are unremarkable. IMPRESSION: Persistent bibasilar pneumonia, perhaps slightly improved on the LEFT compared to the previous chest x-ray of 02/27/2020. Electronically Signed   By: Bary Richard M.D.   On: 03/13/2020 18:04    Procedures Procedures (including critical care time)  Medications Ordered in UC Medications  methylPREDNISolone sodium succinate (SOLU-MEDROL) 125 mg/2 mL injection 125 mg (125 mg Intramuscular Given 03/13/20 1742)  AeroChamber Plus Flo-Vu Large MISC 1 each (1 each Other Given 03/13/20 1741)    Initial Impression / Assessment and Plan / UC Course  I have reviewed the triage vital signs and the nursing notes.  Pertinent labs & imaging results that were available during my care of the patient were reviewed by me and considered in my medical decision making (see chart for details).     Covid 19 Viral Pneumonia SOB Tachycardia Tachypnea Fatigue  Presents with SOB with known Covid 19 and viral pneumonia Has satisfied quarantine requirement Has  O2 at home via concentrator, usually sleeps with 2LPM Sleeping prone at home, states that this helps CXR today shows mild improvement when compared to previous xray when he was inpatient Nailbeds cyanotic with activity, cap refill normal when at rest Solumedrol 125mg  IM in office today Spacer for albuterol inhaler given in office today Prescribed pulse oximeter If O2 sats are dipping below 90% with O2, discussed with patient that further evaluation in the ER is warranted CBC, CMP, CRP, D-dimer pending Will notify of abnormal results and treat accordingly Prescribed azithromycin Prescribed steroid taper Discussed with patient, that he may titrate O2 at home, if he is consistently needing more than 2LPM of oxygen, this also warrants further evaluation in the ER Follow up with Covid follow up clinic as scheduled Get plenty of rest and push fluids May use OTC medications for supportive care at home Follow up in the ER for increased SOB, decreased urine output, increased work of breathing not relieved by rest or oxygen, trouble swallowing, trouble breathing, other concerning symptoms Verbalized understanding and is in agreement with treatment plan Final Clinical Impressions(s) / UC Diagnoses   Final diagnoses:  COVID-19  Pneumonia, viral  SOB (shortness of breath)  Tachycardia  Tachypnea  Other fatigue     Discharge Instructions     I have sent in a steroid taper for you to take as well. Take 6 tablets for the first two days, take 5 tablets for day three and four, take 4 tablets for days five and six, take 3 tablets for days seven and eight, take 2 tablets for day nine and ten, then take 1 tablet for days eleven and twelve.  Prescribed azithromycin. Take 2 tablets today and one tablet per day for the next 4 days  Take it easy and listen to your body  Check your oxygen saturation 3 times per day and  as needed  May increase oxygen at home up to 4 liters per minute, if you need more  than this, go to the ER  Follow up with this office or with primary care as needed    ED Prescriptions    Medication Sig Dispense Auth. Provider   predniSONE (STERAPRED UNI-PAK 21 TAB) 10 MG (21) TBPK tablet Take by mouth daily. Take 6 tabs by mouth daily  for 2 days, then 5 tabs for 2 days, then 4 tabs for 2 days, then 3 tabs for 2 days, 2 tabs for 2 days, then 1 tab by mouth daily for 2 days 42 tablet Moshe Cipro, NP   azithromycin (ZITHROMAX) 250 MG tablet Take 1 tablet (250 mg total) by mouth daily. Take first 2 tablets together, then 1 every day until finished. 6 tablet Moshe Cipro, NP   Misc. Devices (PULSE OXIMETER) MISC 1 Device by Does not apply route 3 (three) times daily. 1 each Moshe Cipro, NP     PDMP not reviewed this encounter.   Moshe Cipro, NP 03/15/20 4650    Moshe Cipro, NP 03/15/20 0830

## 2020-03-24 ENCOUNTER — Other Ambulatory Visit: Payer: Self-pay

## 2020-03-24 ENCOUNTER — Ambulatory Visit (INDEPENDENT_AMBULATORY_CARE_PROVIDER_SITE_OTHER): Payer: HRSA Program | Admitting: Nurse Practitioner

## 2020-03-24 VITALS — BP 110/72 | HR 107 | Temp 97.7°F | Ht 71.0 in | Wt 212.0 lb

## 2020-03-24 DIAGNOSIS — R0681 Apnea, not elsewhere classified: Secondary | ICD-10-CM | POA: Diagnosis not present

## 2020-03-24 DIAGNOSIS — U071 COVID-19: Secondary | ICD-10-CM | POA: Diagnosis not present

## 2020-03-24 DIAGNOSIS — J1282 Pneumonia due to coronavirus disease 2019: Secondary | ICD-10-CM

## 2020-03-24 DIAGNOSIS — J9601 Acute respiratory failure with hypoxia: Secondary | ICD-10-CM

## 2020-03-24 NOTE — Progress Notes (Signed)
@Patient  ID: , male    DOB: 01/28/1991, 29 y.o.   MRN: [id number]  Chief Complaint  Patient presents with  . Covid Positive    Pos: 8/16 Hosp: 8/20-8/26. Urgent Care 9/5 for SOB was given Azithromycin, Prednisone    Referring provider: No ref. provider found  HPI        No Known Allergies  Immunization History  Administered Date(s) Administered  . Tdap 01/16/2015    No past medical history on file.  Tobacco History: Social History   Tobacco Use  Smoking Status Never Smoker  Smokeless Tobacco Never Used   Counseling given: Not Answered   Outpatient Encounter Medications as of 03/24/2020  Medication Sig  . albuterol (VENTOLIN HFA) 108 (90 Base) MCG/ACT inhaler Inhale 2 puffs into the lungs every 4 (four) hours as needed for wheezing or shortness of breath.  03/26/2020 azithromycin (ZITHROMAX) 250 MG tablet Take 1 tablet (250 mg total) by mouth daily. Take first 2 tablets together, then 1 every day until finished.  . Fluticasone-Salmeterol (ADVAIR DISKUS) 100-50 MCG/DOSE AEPB Inhale 1 puff into the lungs 2 (two) times daily.  . Misc. Devices (PULSE OXIMETER) MISC 1 Device by Does not apply route 3 (three) times daily.  . predniSONE (STERAPRED UNI-PAK 21 TAB) 10 MG (21) TBPK tablet Take by mouth daily. Take 6 tabs by mouth daily  for 2 days, then 5 tabs for 2 days, then 4 tabs for 2 days, then 3 tabs for 2 days, 2 tabs for 2 days, then 1 tab by mouth daily for 2 days  . acetaminophen (TYLENOL) 325 MG tablet Take 650 mg by mouth every 6 (six) hours as needed for mild pain or fever. (Patient not taking: Reported on 03/24/2020)  . benzonatate (TESSALON) 100 MG capsule Take 1 capsule (100 mg total) by mouth every 8 (eight) hours. (Patient not taking: Reported on 03/24/2020)   No facility-administered encounter medications on file as of 03/24/2020.     Review of Systems  Review of Systems     Physical Exam  BP 110/72 (BP Location: Left Arm)   Pulse (!) 107   Temp  97.7 F (36.5 C)   Ht 5\' 11"  (1.803 m)   Wt 212 lb 0.1 oz (96.2 kg)   SpO2 95%   BMI 29.57 kg/m   Wt Readings from Last 5 Encounters:  03/24/20 212 lb 0.1 oz (96.2 kg)  02/27/20 210 lb (95.3 kg)  01/16/15 188 lb 4 oz (85.4 kg)     Physical Exam   Lab Results:  CBC    Component Value Date/Time   WBC 10.2 03/13/2020 1806   RBC 4.98 03/13/2020 1806   HGB 13.9 03/13/2020 1806   HCT 43.5 03/13/2020 1806   PLT 206 03/13/2020 1806   MCV 87.3 03/13/2020 1806   MCH 27.9 03/13/2020 1806   MCHC 32.0 03/13/2020 1806   RDW 14.4 03/13/2020 1806   LYMPHSABS 2.0 03/13/2020 1806   MONOABS 0.7 03/13/2020 1806   EOSABS 0.1 03/13/2020 1806   BASOSABS 0.0 03/13/2020 1806    BMET    Component Value Date/Time   NA 137 03/13/2020 1806   K 4.1 03/13/2020 1806   CL 99 03/13/2020 1806   CO2 28 03/13/2020 1806   GLUCOSE 94 03/13/2020 1806   BUN 10 03/13/2020 1806   CREATININE 0.80 03/13/2020 1806   CALCIUM 9.3 03/13/2020 1806   GFRNONAA >60 03/13/2020 1806   GFRAA >60 03/13/2020 1806    BNP No results found  for: BNP  ProBNP No results found for: PROBNP  Imaging: DG Chest 2 View  Result Date: 03/13/2020 CLINICAL DATA:  Shortness of breath, dry cough. COVID positive on February 22, 2020. EXAM: CHEST - 2 VIEW COMPARISON:  Chest x-ray dated 02/27/2020. Chest CT dated 03/01/2020. FINDINGS: Persistent bibasilar airspace opacities, not significantly changed on the RIGHT, perhaps slightly improved aeration at the LEFT lung base. No pleural effusion or pneumothorax is seen. Heart size and mediastinal contours are stable. Osseous structures about the chest are unremarkable. IMPRESSION: Persistent bibasilar pneumonia, perhaps slightly improved on the LEFT compared to the previous chest x-ray of 02/27/2020. Electronically Signed   By: Bary Richard M.D.   On: 03/13/2020 18:04   CT HEAD WO CONTRAST  Result Date: 02/28/2020 CLINICAL DATA:  29 year old male with history of seizure. COVID-19.  Abnormal neurologic examination. EXAM: CT HEAD WITHOUT CONTRAST TECHNIQUE: Contiguous axial images were obtained from the base of the skull through the vertex without intravenous contrast. COMPARISON:  Brain MRI 02/28/2020. FINDINGS: Brain: No evidence of acute infarction, hemorrhage, hydrocephalus, extra-axial collection or mass lesion/mass effect. Vascular: No hyperdense vessel or unexpected calcification. Skull: Normal. Negative for fracture or focal lesion. Sinuses/Orbits: Multifocal mucosal thickening in the frontal, anterior ethmoid and right maxillary sinuses. No air-fluid levels. Other: None. IMPRESSION: 1. No acute intracranial abnormalities. The appearance of the brain is normal. 2. Paranasal sinus disease, as above, without acute findings. Electronically Signed   By: Trudie Reed M.D.   On: 02/28/2020 06:55   CT ANGIO CHEST PE W OR WO CONTRAST  Result Date: 03/01/2020 CLINICAL DATA:  29 year old male with recent cough and sore throat. High probability of pulmonary embolus. Respiratory failure due to COVID-19. EXAM: CT ANGIOGRAPHY CHEST WITH CONTRAST TECHNIQUE: Multidetector CT imaging of the chest was performed using the standard protocol during bolus administration of intravenous contrast. Multiplanar CT image reconstructions and MIPs were obtained to evaluate the vascular anatomy. CONTRAST:  51mL OMNIPAQUE IOHEXOL 300 MG/ML  SOLN COMPARISON:  Portable chest 02/27/2020. FINDINGS: Cardiovascular: Good contrast bolus timing in the pulmonary arterial tree. No focal filling defect identified in the pulmonary arteries to suggest acute pulmonary embolism. Negative visible aorta, 4 vessel arch configuration (the left vertebral artery arises directly from the arch, normal variant). No pericardial effusion. Mediastinum/Nodes: Negative.  No lymphadenopathy. Lungs/Pleura: Relatively low lung volumes. Diffuse bilateral lower lobe pulmonary ground-glass opacity, progressing to consolidation in both lower  lobes. Early peribronchial consolidation also in the middle lobes. There is bilateral more peripheral and left central ground-glass in the upper lobes. The major airways remain patent.  No pleural effusion. Upper Abdomen: Negative visible liver, spleen, pancreas, adrenal glands, bowel and right renal upper pole. Musculoskeletal: Negative. Review of the MIP images confirms the above findings. IMPRESSION: 1. Negative for acute pulmonary embolus. 2. Bilateral COVID-19 pneumonia with severe lower lobe involvement. No pleural effusion. Electronically Signed   By: Odessa Fleming M.D.   On: 03/01/2020 14:02   MR BRAIN WO CONTRAST  Result Date: 02/28/2020 CLINICAL DATA:  Seizure EXAM: MRI HEAD WITHOUT CONTRAST TECHNIQUE: Multiplanar, multiecho pulse sequences of the brain and surrounding structures were obtained without intravenous contrast. COMPARISON:  None. FINDINGS: BRAIN: No acute infarct, acute hemorrhage or extra-axial collection. Normal white matter signal. Normal volume of CSF spaces. No chronic microhemorrhage. Normal midline structures. The hippocampi are normal and symmetric in size and signal. The hypothalamus and mamillary bodies are normal. There is no cortical ectopia or dysplasia. VASCULAR: Major flow voids are preserved. SKULL AND UPPER  CERVICAL SPINE: Normal calvarium and skull base. Visualized upper cervical spine and soft tissues are normal. SINUSES/ORBITS: Frontal sinus mucosal thickening.  Normal orbits. IMPRESSION: Normal brain MRI. Electronically Signed   By: Deatra Robinson M.D.   On: 02/28/2020 01:04   DG Chest Portable 1 View  Result Date: 02/27/2020 CLINICAL DATA:  Cough and sore throat EXAM: PORTABLE CHEST 1 VIEW COMPARISON:  06/10/2014 FINDINGS: Shallow lung inflation with bibasilar opacities. No pleural effusion or pneumothorax. Normal cardiomediastinal contours. IMPRESSION: Shallow lung inflation with bibasilar opacities, possibly atelectasis or infection. Electronically Signed   By: Deatra Robinson M.D.   On: 02/27/2020 00:58   EEG adult  Result Date: 02/29/2020 Charlsie Quest, MD     02/29/2020  4:44 PM Patient Name: Saba Neuman MRN: [id number] Epilepsy Attending: Charlsie Quest Referring Physician/Provider: Dr Trey Paula Date: 02/29/2020 Duration: 28.14 mins Patient history: 29yo M with transient alteration of awareness at night. EEG to evaluate for seizure. Level of alertness: Awake, asleep AEDs during EEG study: None Technical aspects: This EEG study was done with scalp electrodes positioned according to the 10-20 International system of electrode placement. Electrical activity was acquired at a sampling rate of 500Hz  and reviewed with a high frequency filter of 70Hz  and a low frequency filter of 1Hz . EEG data were recorded continuously and digitally stored. Description: The posterior dominant rhythm consists of 9-10 Hz activity of moderate voltage (25-35 uV) seen predominantly in posterior head regions, symmetric and reactive to eye opening and eye closing. Sleep was characterized by vertex waves, sleep spindles (12 to 14 Hz), maximal frontocentral region. Physiologic photic driving was seen during photic stimulation. Hyperventilation was not performed.   IMPRESSION: This study is within normal limits. No seizures or epileptiform discharges were seen throughout the recording. Priyanka O Yadav   VAS LOWER EXTREMITY VENOUS (DVT)  Result Date: 03/03/2020  Lower Venous DVTStudy Indications: Elevated D dimer , Covid +.  Performing Technologist: RCT RDMS  Examination Guidelines: A complete evaluation includes B-mode imaging, spectral Doppler, color Doppler, and power Doppler as needed of all accessible portions of each vessel. Bilateral testing is considered an integral part of a complete examination. Limited examinations for reoccurring indications may be performed as noted. The reflux portion of the exam is performed with the patient in reverse Trendelenburg.   +---------+---------------+---------+-----------+----------+--------------+ RIGHT    CompressibilityPhasicitySpontaneityPropertiesThrombus Aging +---------+---------------+---------+-----------+----------+--------------+ CFV      Full           Yes      Yes                                 +---------+---------------+---------+-----------+----------+--------------+ SFJ      Full                                                        +---------+---------------+---------+-----------+----------+--------------+ FV Prox  Full                                                        +---------+---------------+---------+-----------+----------+--------------+ FV Mid   Full                                                        +---------+---------------+---------+-----------+----------+--------------+  FV DistalFull                                                        +---------+---------------+---------+-----------+----------+--------------+ PFV      Full                                                        +---------+---------------+---------+-----------+----------+--------------+ POP      Full           Yes      Yes                                 +---------+---------------+---------+-----------+----------+--------------+ PTV      Full                                                        +---------+---------------+---------+-----------+----------+--------------+ PERO     Full                                                        +---------+---------------+---------+-----------+----------+--------------+   +---------+---------------+---------+-----------+----------+--------------+ LEFT     CompressibilityPhasicitySpontaneityPropertiesThrombus Aging +---------+---------------+---------+-----------+----------+--------------+ CFV      Full           Yes      Yes                                  +---------+---------------+---------+-----------+----------+--------------+ SFJ      Full                                                        +---------+---------------+---------+-----------+----------+--------------+ FV Prox  Full                                                        +---------+---------------+---------+-----------+----------+--------------+ FV Mid   Full                                                        +---------+---------------+---------+-----------+----------+--------------+ FV DistalFull                                                        +---------+---------------+---------+-----------+----------+--------------+  PFV      Full                                                        +---------+---------------+---------+-----------+----------+--------------+ POP      Full           Yes      Yes                                 +---------+---------------+---------+-----------+----------+--------------+ PTV      Full                                                        +---------+---------------+---------+-----------+----------+--------------+ PERO     Full                                                        +---------+---------------+---------+-----------+----------+--------------+     Summary: RIGHT: - There is no evidence of deep vein thrombosis in the lower extremity.  - No cystic structure found in the popliteal fossa.  LEFT: - There is no evidence of deep vein thrombosis in the lower extremity.  - No cystic structure found in the popliteal fossa.  *See table(s) above for measurements and observations. Electronically signed by Sherald Hesshristopher Clark MD on 03/03/2020 at 3:23:23 PM.    Final      Assessment & Plan:   No problem-specific Assessment & Plan notes found for this encounter.     Ivonne Andrewonya S Vertis Bauder, NP 03/24/2020

## 2020-03-24 NOTE — Patient Instructions (Signed)
Covid pneumonia Acute hypoxic respiratory failure:  Slowly improving:  Will place referral to pulmonary: Continue O2 at night - at 4 L [place] - will need sleep study [facility] noted that patient might have sleep apnea - will need repeat imaging  - most recent chest xray did show slight improvement  Stay active  Stay well hydrated  Patient walked in office today - O2 sats remained above 92% for entire walk - heart rate tachy  Encourage deep breathing exercises   Follow up:  Follow up in 1 month or sooner if needed

## 2020-03-24 NOTE — Progress Notes (Signed)
@Patient  ID: , male    DOB: 03/15/1991, 29 y.o.   MRN: [id number]  Chief Complaint  Patient presents with  . Covid Positive    Pos: 8/16 Hosp: 8/20-8/26. Urgent Care 9/5 for SOB was given Azithromycin, Prednisone    Referring provider: No ref. provider found   29 year old male with no significant health history. Diagnosed with covid 02/22/20.  Recent significant events:  02/26/20 - 03/03/20 Hospital admission: requiring 3 L of oxygen. Chest x-ray with bilateral infiltrates. Given IV remdesivir and IV Solu-Medrol.Elevated D dimer: negative CT PE for PE on 8/24.  Negative LE 9/24 for DVT.  8/22: Patient had a brief episode of unresponsiveness in the morning at the ER. No neurological deficit. Thought to be hypoxia or sleepiness.  03/13/20 ED visit: SOB , cough fatigue - prescribed azithromycin and prednisone   Imaging:  03/13/20 chest xray: Persistent bibasilar pneumonia, perhaps slightly improved on the LEFT compared to the previous chest x-ray of 02/27/2020.  03/01/20 CTA: 1. Negative for acute pulmonary embolus. 2. Bilateral COVID-19 pneumonia with severe lower lobe involvement. No pleural effusion.  02/27/20 chest xray: Shallow lung inflation with bibasilar opacities, possibly atelectasis or infection.  HPI  Patient presents today for post COVID care clinic visit.  Patient was admitted to the hospital on 02/26/2020 through 03/03/2020 for Covid pneumonia and acute hypoxic respiratory failure.  He was discharged home with 3 L of O2.  He returned to the ED on 03/13/2020 with continued shortness of breath and cough.  He was prescribed azithromycin and prednisone at that visit.  Patient states that he is much improved since hospital discharge.  He is still using a 4 L nasal cannula at night.  Patient was advised at hospital discharge that he would need a sleep study due to apneic episode in hospital.  Patient was walked in office today O2 sats remained above 92% for the entire walk on  room air.  Encourage deep breathing exercises. Denies f/c/s, n/v/d, hemoptysis, PND, chest pain or edema.      No Known Allergies  Immunization History  Administered Date(s) Administered  . Tdap 01/16/2015    History reviewed. No pertinent past medical history.  Tobacco History: Social History   Tobacco Use  Smoking Status Never Smoker  Smokeless Tobacco Never Used   Counseling given: Yes   Outpatient Encounter Medications as of 03/24/2020  Medication Sig  . albuterol (VENTOLIN HFA) 108 (90 Base) MCG/ACT inhaler Inhale 2 puffs into the lungs every 4 (four) hours as needed for wheezing or shortness of breath.  03/26/2020 azithromycin (ZITHROMAX) 250 MG tablet Take 1 tablet (250 mg total) by mouth daily. Take first 2 tablets together, then 1 every day until finished.  . Fluticasone-Salmeterol (ADVAIR DISKUS) 100-50 MCG/DOSE AEPB Inhale 1 puff into the lungs 2 (two) times daily.  . Misc. Devices (PULSE OXIMETER) MISC 1 Device by Does not apply route 3 (three) times daily.  . predniSONE (STERAPRED UNI-PAK 21 TAB) 10 MG (21) TBPK tablet Take by mouth daily. Take 6 tabs by mouth daily  for 2 days, then 5 tabs for 2 days, then 4 tabs for 2 days, then 3 tabs for 2 days, 2 tabs for 2 days, then 1 tab by mouth daily for 2 days  . acetaminophen (TYLENOL) 325 MG tablet Take 650 mg by mouth every 6 (six) hours as needed for mild pain or fever. (Patient not taking: Reported on 03/24/2020)  . benzonatate (TESSALON) 100 MG capsule Take 1 capsule (  100 mg total) by mouth every 8 (eight) hours. (Patient not taking: Reported on 03/24/2020)   No facility-administered encounter medications on file as of 03/24/2020.     Review of Systems  Review of Systems  Constitutional: Positive for fatigue. Negative for fever.  HENT: Negative.   Respiratory: Positive for cough and shortness of breath.   Cardiovascular: Negative.  Negative for chest pain, palpitations and leg swelling.  Gastrointestinal: Negative.    Allergic/Immunologic: Negative.   Neurological: Negative.   Psychiatric/Behavioral: Negative.        Physical Exam  BP 110/72 (BP Location: Left Arm)   Pulse (!) 107   Temp 97.7 F (36.5 C)   Ht 5\' 11"  (1.803 m)   Wt 212 lb 0.1 oz (96.2 kg)   SpO2 95%   BMI 29.57 kg/m   Wt Readings from Last 5 Encounters:  03/24/20 212 lb 0.1 oz (96.2 kg)  02/27/20 210 lb (95.3 kg)  01/16/15 188 lb 4 oz (85.4 kg)     Physical Exam Vitals and nursing note reviewed.  Constitutional:      General: He is not in acute distress.    Appearance: He is well-developed.  Cardiovascular:     Rate and Rhythm: Normal rate and regular rhythm.  Pulmonary:     Effort: Pulmonary effort is normal.     Breath sounds: Normal breath sounds.  Musculoskeletal:     Right lower leg: No edema.     Left lower leg: No edema.  Skin:    General: Skin is warm and dry.  Neurological:     Mental Status: He is alert and oriented to person, place, and time.  Psychiatric:        Mood and Affect: Mood normal.        Behavior: Behavior normal.      Imaging: DG Chest 2 View  Result Date: 03/13/2020 CLINICAL DATA:  Shortness of breath, dry cough. COVID positive on February 22, 2020. EXAM: CHEST - 2 VIEW COMPARISON:  Chest x-ray dated 02/27/2020. Chest CT dated 03/01/2020. FINDINGS: Persistent bibasilar airspace opacities, not significantly changed on the RIGHT, perhaps slightly improved aeration at the LEFT lung base. No pleural effusion or pneumothorax is seen. Heart size and mediastinal contours are stable. Osseous structures about the chest are unremarkable. IMPRESSION: Persistent bibasilar pneumonia, perhaps slightly improved on the LEFT compared to the previous chest x-ray of 02/27/2020. Electronically Signed   By: Bary RichardStan  Maynard M.D.   On: 03/13/2020 18:04   CT HEAD WO CONTRAST  Result Date: 02/28/2020 CLINICAL DATA:  29 year old male with history of seizure. COVID-19. Abnormal neurologic examination. EXAM: CT  HEAD WITHOUT CONTRAST TECHNIQUE: Contiguous axial images were obtained from the base of the skull through the vertex without intravenous contrast. COMPARISON:  Brain MRI 02/28/2020. FINDINGS: Brain: No evidence of acute infarction, hemorrhage, hydrocephalus, extra-axial collection or mass lesion/mass effect. Vascular: No hyperdense vessel or unexpected calcification. Skull: Normal. Negative for fracture or focal lesion. Sinuses/Orbits: Multifocal mucosal thickening in the frontal, anterior ethmoid and right maxillary sinuses. No air-fluid levels. Other: None. IMPRESSION: 1. No acute intracranial abnormalities. The appearance of the brain is normal. 2. Paranasal sinus disease, as above, without acute findings. Electronically Signed   By: Trudie Reedaniel  Entrikin M.D.   On: 02/28/2020 06:55   CT ANGIO CHEST PE W OR WO CONTRAST  Result Date: 03/01/2020 CLINICAL DATA:  29 year old male with recent cough and sore throat. High probability of pulmonary embolus. Respiratory failure due to COVID-19. EXAM: CT ANGIOGRAPHY CHEST  WITH CONTRAST TECHNIQUE: Multidetector CT imaging of the chest was performed using the standard protocol during bolus administration of intravenous contrast. Multiplanar CT image reconstructions and MIPs were obtained to evaluate the vascular anatomy. CONTRAST:  75mL OMNIPAQUE IOHEXOL 300 MG/ML  SOLN COMPARISON:  Portable chest 02/27/2020. FINDINGS: Cardiovascular: Good contrast bolus timing in the pulmonary arterial tree. No focal filling defect identified in the pulmonary arteries to suggest acute pulmonary embolism. Negative visible aorta, 4 vessel arch configuration (the left vertebral artery arises directly from the arch, normal variant). No pericardial effusion. Mediastinum/Nodes: Negative.  No lymphadenopathy. Lungs/Pleura: Relatively low lung volumes. Diffuse bilateral lower lobe pulmonary ground-glass opacity, progressing to consolidation in both lower lobes. Early peribronchial consolidation also  in the middle lobes. There is bilateral more peripheral and left central ground-glass in the upper lobes. The major airways remain patent.  No pleural effusion. Upper Abdomen: Negative visible liver, spleen, pancreas, adrenal glands, bowel and right renal upper pole. Musculoskeletal: Negative. Review of the MIP images confirms the above findings. IMPRESSION: 1. Negative for acute pulmonary embolus. 2. Bilateral COVID-19 pneumonia with severe lower lobe involvement. No pleural effusion. Electronically Signed   By: Odessa Fleming M.D.   On: 03/01/2020 14:02   MR BRAIN WO CONTRAST  Result Date: 02/28/2020 CLINICAL DATA:  Seizure EXAM: MRI HEAD WITHOUT CONTRAST TECHNIQUE: Multiplanar, multiecho pulse sequences of the brain and surrounding structures were obtained without intravenous contrast. COMPARISON:  None. FINDINGS: BRAIN: No acute infarct, acute hemorrhage or extra-axial collection. Normal white matter signal. Normal volume of CSF spaces. No chronic microhemorrhage. Normal midline structures. The hippocampi are normal and symmetric in size and signal. The hypothalamus and mamillary bodies are normal. There is no cortical ectopia or dysplasia. VASCULAR: Major flow voids are preserved. SKULL AND UPPER CERVICAL SPINE: Normal calvarium and skull base. Visualized upper cervical spine and soft tissues are normal. SINUSES/ORBITS: Frontal sinus mucosal thickening.  Normal orbits. IMPRESSION: Normal brain MRI. Electronically Signed   By: Deatra Robinson M.D.   On: 02/28/2020 01:04   DG Chest Portable 1 View  Result Date: 02/27/2020 CLINICAL DATA:  Cough and sore throat EXAM: PORTABLE CHEST 1 VIEW COMPARISON:  06/10/2014 FINDINGS: Shallow lung inflation with bibasilar opacities. No pleural effusion or pneumothorax. Normal cardiomediastinal contours. IMPRESSION: Shallow lung inflation with bibasilar opacities, possibly atelectasis or infection. Electronically Signed   By: Deatra Robinson M.D.   On: 02/27/2020 00:58   EEG  adult  Result Date: 02/29/2020 Charlsie Quest, MD     02/29/2020  4:44 PM Patient Name: Aerion Bagdasarian MRN: [id number] Epilepsy Attending: Charlsie Quest Referring Physician/Provider: Dr Trey Paula Date: 02/29/2020 Duration: 28.14 mins Patient history: 29yo M with transient alteration of awareness at night. EEG to evaluate for seizure. Level of alertness: Awake, asleep AEDs during EEG study: None Technical aspects: This EEG study was done with scalp electrodes positioned according to the 10-20 International system of electrode placement. Electrical activity was acquired at a sampling rate of  and reviewed with a high frequency filter of  and a low frequency filter of . EEG data were recorded continuously and digitally stored. Description: The posterior dominant rhythm consists of 9-10 Hz activity of moderate voltage (25-35 uV) seen predominantly in posterior head regions, symmetric and reactive to eye opening and eye closing. Sleep was characterized by vertex waves, sleep spindles (12 to 14 Hz), maximal frontocentral region. Physiologic photic driving was seen during photic stimulation. Hyperventilation was not performed.   IMPRESSION: This study is within normal limits.  No seizures or epileptiform discharges were seen throughout the recording. Priyanka O Yadav   VAS [place] LOWER EXTREMITY VENOUS (DVT)  Result Date: 03/03/2020  Lower Venous DVTStudy Indications: Elevated D dimer , Covid +.  Performing Technologist: Jannet Askew RCT RDMS  Examination Guidelines: A complete evaluation includes B-mode imaging, spectral Doppler, color Doppler, and power Doppler as needed of all accessible portions of each vessel. Bilateral testing is considered an integral part of a complete examination. Limited examinations for reoccurring indications may be performed as noted. The reflux portion of the exam is performed with the patient in reverse Trendelenburg.   +---------+---------------+---------+-----------+----------+--------------+ RIGHT    CompressibilityPhasicitySpontaneityPropertiesThrombus Aging +---------+---------------+---------+-----------+----------+--------------+ CFV      Full           Yes      Yes                                 +---------+---------------+---------+-----------+----------+--------------+ SFJ      Full                                                        +---------+---------------+---------+-----------+----------+--------------+ FV Prox  Full                                                        +---------+---------------+---------+-----------+----------+--------------+ FV Mid   Full                                                        +---------+---------------+---------+-----------+----------+--------------+ FV DistalFull                                                        +---------+---------------+---------+-----------+----------+--------------+ PFV      Full                                                        +---------+---------------+---------+-----------+----------+--------------+ POP      Full           Yes      Yes                                 +---------+---------------+---------+-----------+----------+--------------+ PTV      Full                                                        +---------+---------------+---------+-----------+----------+--------------+ PERO  Full                                                        +---------+---------------+---------+-----------+----------+--------------+   +---------+---------------+---------+-----------+----------+--------------+ LEFT     CompressibilityPhasicitySpontaneityPropertiesThrombus Aging +---------+---------------+---------+-----------+----------+--------------+ CFV      Full           Yes      Yes                                  +---------+---------------+---------+-----------+----------+--------------+ SFJ      Full                                                        +---------+---------------+---------+-----------+----------+--------------+ FV Prox  Full                                                        +---------+---------------+---------+-----------+----------+--------------+ FV Mid   Full                                                        +---------+---------------+---------+-----------+----------+--------------+ FV DistalFull                                                        +---------+---------------+---------+-----------+----------+--------------+ PFV      Full                                                        +---------+---------------+---------+-----------+----------+--------------+ POP      Full           Yes      Yes                                 +---------+---------------+---------+-----------+----------+--------------+ PTV      Full                                                        +---------+---------------+---------+-----------+----------+--------------+ PERO     Full                                                        +---------+---------------+---------+-----------+----------+--------------+  Summary: RIGHT: - There is no evidence of deep vein thrombosis in the lower extremity.  - No cystic structure found in the popliteal fossa.  LEFT: - There is no evidence of deep vein thrombosis in the lower extremity.  - No cystic structure found in the popliteal fossa.  *See table(s) above for measurements and observations. Electronically signed by Sherald Hess MD on 03/03/2020 at 3:23:23 PM.    Final      Assessment & Plan:   Pneumonia due to COVID-19 virus Acute hypoxic respiratory failure:  Slowly improving:  Will place referral to pulmonary: Continue O2 at night - at 4 L [place] - will need sleep study [facility] noted that  patient might have sleep apnea - will need repeat imaging  - most recent chest xray did show slight improvement  Stay active  Stay well hydrated  Patient walked in office today - O2 sats remained above 92% for entire walk - heart rate tachy  Encourage deep breathing exercises   Follow up:  Follow up in 1 month or sooner if needed     Ivonne Andrew, NP 03/24/2020

## 2020-03-24 NOTE — Assessment & Plan Note (Signed)
Acute hypoxic respiratory failure:  Slowly improving:  Will place referral to pulmonary: Continue O2 at night - at 4 L [place] - will need sleep study [facility] noted that patient might have sleep apnea - will need repeat imaging  - most recent chest xray did show slight improvement  Stay active  Stay well hydrated  Patient walked in office today - O2 sats remained above 92% for entire walk - heart rate tachy  Encourage deep breathing exercises   Follow up:  Follow up in 1 month or sooner if needed

## 2020-04-08 ENCOUNTER — Other Ambulatory Visit: Payer: Self-pay

## 2020-04-08 ENCOUNTER — Ambulatory Visit (INDEPENDENT_AMBULATORY_CARE_PROVIDER_SITE_OTHER): Payer: HRSA Program | Admitting: Nurse Practitioner

## 2020-04-08 ENCOUNTER — Encounter: Payer: Self-pay | Admitting: Nurse Practitioner

## 2020-04-08 VITALS — BP 121/53 | HR 100 | Temp 98.3°F | Ht 71.0 in | Wt 217.0 lb

## 2020-04-08 DIAGNOSIS — J1282 Pneumonia due to coronavirus disease 2019: Secondary | ICD-10-CM

## 2020-04-08 DIAGNOSIS — Z7689 Persons encountering health services in other specified circumstances: Secondary | ICD-10-CM | POA: Diagnosis not present

## 2020-04-08 DIAGNOSIS — U071 COVID-19: Secondary | ICD-10-CM | POA: Diagnosis not present

## 2020-04-08 NOTE — Progress Notes (Signed)
[facility] Patient [facility] [address] [place], [place]  [address] Phone:  [phone]   Fax:  [phone]   New Patient Office Visit  Subjective:  Patient ID: Cosmo Tetreault, male    DOB: [birth year]  Age: 29 y.o. MRN: [id number]  CC:  Chief Complaint  Patient presents with   Establish Care    having SOB at activities    HPI Clive Parcel presents for establish care. He  has no past medical history on file.   Dyspnea Patient complains of shortness of breath. Symptoms occur with activity i.e standing for long.  Simple exercises. Symptoms began Several weeks post coronavirus , stable since. Associated symptoms include  Occasional cough. He denies constant cough, cough after eating, drainage from nose, dyspnea when laying down, tightness in chest, wheezing and Tobacco use. He had had recent travel with his job however has not been back to work since August. Weight has been stable. Symptoms are exacerbated by any exercise. Symptoms are alleviated by rest and the use of inhalers  No past medical history on file.  No past surgical history on file.  Family History  Problem Relation Age of Onset   Diabetes Mellitus II Neg Hx     Social History   Socioeconomic History   Marital status: Single    Spouse name: Not on file   Number of children: Not on file   Years of education: Not on file   Highest education level: Not on file  Occupational History   Not on file  Tobacco Use   Smoking status: Never Smoker   Smokeless tobacco: Never Used  Substance and Sexual Activity   Alcohol use: No   Drug use: No   Sexual activity: Not on file  Other Topics Concern   Not on file  Social History Narrative   Not on file   Social Determinants of Health   Financial Resource Strain:    Difficulty of Paying Living Expenses: Not on file  Food Insecurity:    Worried About Running Out of Food in the Last Year: Not on file   Ran Out of Food in the Last Year: Not on file   Transportation Needs:    Lack of Transportation (Medical): Not on file   Lack of Transportation (Non-Medical): Not on file  Physical Activity:    Days of Exercise per Week: Not on file   Minutes of Exercise per Session: Not on file  Stress:    Feeling of Stress : Not on file  Social Connections:    Frequency of Communication with Friends and Family: Not on file   Frequency of Social Gatherings with Friends and Family: Not on file   Attends Religious Services: Not on file   Active Member of Clubs or Organizations: Not on file   Attends Banker Meetings: Not on file   Marital Status: Not on file  Intimate Partner Violence:    Fear of Current or Ex-Partner: Not on file   Emotionally Abused: Not on file   Physically Abused: Not on file   Sexually Abused: Not on file    ROS Review of Systems  Constitutional: Negative for chills and fatigue.  Eyes: Negative for visual disturbance.  Respiratory: Positive for cough (occasional cough with phelgm mucous colored ) and shortness of breath (improving some with daily activity. He has noticed it with standing;).   Cardiovascular: Negative for chest pain, palpitations and leg swelling.  Gastrointestinal: Negative for constipation, diarrhea and nausea.  Endocrine: Negative.   Genitourinary: Negative.   Musculoskeletal: Negative.   Skin: Negative.   Neurological: Negative for dizziness, syncope and headaches.  Hematological: Negative.   Psychiatric/Behavioral: Negative.     Objective:   Today's Vitals: BP (!) 121/53    Pulse 100    Temp 98.3 F (36.8 C) (Temporal)    Ht 5\' 11"  (1.803 m)    Wt 217 lb (98.4 kg)    SpO2 99%    BMI 30.27 kg/m   Physical Exam Constitutional:      General: He is not in acute distress.    Appearance: He is not ill-appearing, toxic-appearing or diaphoretic.  HENT:     Head: Normocephalic and atraumatic.     Nose: Nose normal.     Mouth/Throat:     Mouth: Mucous membranes are  moist.  Cardiovascular:     Rate and Rhythm: Normal rate. Rhythm irregular.     Pulses: Normal pulses.     Heart sounds: Normal heart sounds.  Pulmonary:     Effort: Pulmonary effort is normal.     Comments: diminished  Abdominal:     General: Bowel sounds are normal.     Palpations: Abdomen is soft.  Musculoskeletal:        General: Normal range of motion.     Cervical back: Normal range of motion.  Skin:    General: Skin is warm and dry.     Capillary Refill: Capillary refill takes less than 2 seconds.  Neurological:     General: No focal deficit present.     Mental Status: He is alert and oriented to person, place, and time.  Psychiatric:        Mood and Affect: Mood normal.        Behavior: Behavior normal.        Thought Content: Thought content normal.        Judgment: Judgment normal.     Assessment & Plan:   Problem List Items Addressed This Visit      Respiratory   Pneumonia due to COVID-19 virus - Primary Encourage patient to make sure that he is wearing proper personal protective equipment daily. Discuss environmental exposure at length encourage self-care Follow-up with pulmonology as scheduled FMLA completion pending     Other Visit Diagnoses    Encounter to establish care          Outpatient Encounter Medications as of 04/08/2020  Medication Sig   albuterol (VENTOLIN HFA) 108 (90 Base) MCG/ACT inhaler Inhale 2 puffs into the lungs every 4 (four) hours as needed for wheezing or shortness of breath.   Fluticasone-Salmeterol (ADVAIR DISKUS) 100-50 MCG/DOSE AEPB Inhale 1 puff into the lungs 2 (two) times daily.   Misc. Devices (PULSE OXIMETER) MISC 1 Device by Does not apply route 3 (three) times daily.   [DISCONTINUED] acetaminophen (TYLENOL) 325 MG tablet Take 650 mg by mouth every 6 (six) hours as needed for mild pain or fever.    [DISCONTINUED] benzonatate (TESSALON) 100 MG capsule Take 1 capsule (100 mg total) by mouth every 8 (eight) hours.    [DISCONTINUED] predniSONE (STERAPRED UNI-PAK 21 TAB) 10 MG (21) TBPK tablet Take by mouth daily. Take 6 tabs by mouth daily  for 2 days, then 5 tabs for 2 days, then 4 tabs for 2 days, then 3 tabs for 2 days, 2 tabs for 2 days, then 1 tab by mouth daily for 2 days   [DISCONTINUED] azithromycin (ZITHROMAX) 250 MG tablet Take 1 tablet (  250 mg total) by mouth daily. Take first 2 tablets together, then 1 every day until finished. (Patient not taking: Reported on 04/08/2020)   No facility-administered encounter medications on file as of 04/08/2020.    Follow-up: No follow-ups on file.   Barbette Merino, NP

## 2020-04-09 ENCOUNTER — Encounter: Payer: Self-pay | Admitting: Nurse Practitioner

## 2020-04-15 ENCOUNTER — Institutional Professional Consult (permissible substitution): Payer: Self-pay | Admitting: Pulmonary Disease

## 2020-04-26 ENCOUNTER — Ambulatory Visit (INDEPENDENT_AMBULATORY_CARE_PROVIDER_SITE_OTHER): Payer: HRSA Program | Admitting: Nurse Practitioner

## 2020-04-26 VITALS — BP 116/82 | HR 90 | Temp 97.7°F | Wt 226.0 lb

## 2020-04-26 DIAGNOSIS — J9600 Acute respiratory failure, unspecified whether with hypoxia or hypercapnia: Secondary | ICD-10-CM

## 2020-04-26 DIAGNOSIS — J1282 Pneumonia due to coronavirus disease 2019: Secondary | ICD-10-CM | POA: Diagnosis not present

## 2020-04-26 DIAGNOSIS — R0681 Apnea, not elsewhere classified: Secondary | ICD-10-CM | POA: Diagnosis not present

## 2020-04-26 DIAGNOSIS — U071 COVID-19: Secondary | ICD-10-CM | POA: Diagnosis not present

## 2020-04-26 NOTE — Assessment & Plan Note (Signed)
Acute hypoxic respiratory failure:  Slowly improving:  Please keep upcoming appointment with pulmonary: Continue O2 at night - at 4 L [place] - will need sleep study [facility] noted that patient might have sleep apnea - will need repeat imaging  - most recent chest xray did show slight improvement  Stay active  Stay well hydrated  O2 sats stable on RA today  Encourage deep breathing exercises   Follow up:  Follow up if needed

## 2020-04-26 NOTE — Patient Instructions (Signed)
Pneumonia due to COVID-19 virus Acute hypoxic respiratory failure:  Slowly improving:  Please keep upcoming appointment with pulmonary: Continue O2 at night - at 4 L [place] - will need sleep study [facility] noted that patient might have sleep apnea - will need repeat imaging  - most recent chest xray did show slight improvement  Stay active  Stay well hydrated  O2 sats stable on RA today  Encourage deep breathing exercises   Follow up:  Follow up if needed

## 2020-04-26 NOTE — Progress Notes (Signed)
@Patient  ID: , male    DOB: September 02, 1990, 29 y.o.   MRN: [id number]  Chief Complaint  Patient presents with  . Follow-up    1 month follow up, feeling better no questions or concerns.     Referring provider: No ref. provider found   29 year old male with no significant health history. Diagnosed with covid 02/22/20.  Recent significant events:  02/26/20 - 03/03/20 Hospital admission: requiring 3 L of oxygen. Chest x-ray with bilateral infiltrates. Given IV remdesivir and IV Solu-Medrol.Elevated D dimer: negative CT PE for PE on 8/24. Negative LE 9/24 for DVT.  8/22: Patient had a brief episode of unresponsiveness in the morning at the ER. No neurological deficit. Thought to be hypoxia or sleepiness.  03/13/20 ED visit: SOB , cough fatigue - prescribed azithromycin and prednisone   Imaging:  03/13/20 chest xray: Persistent bibasilar pneumonia, perhaps slightly improved on the LEFT compared to the previous chest x-ray of 02/27/2020.  03/01/20 CTA: 1. Negative for acute pulmonary embolus. 2. Bilateral COVID-19 pneumonia with severe lower lobe involvement. No pleural effusion.  02/27/20 chest xray: Shallow lung inflation with bibasilar opacities, possibly atelectasis or infection.  HPI  Patient presents today for post COVID care clinic visit follow up.  Patient was admitted to the hospital on 02/26/2020 through 03/03/2020 for Covid pneumonia and acute hypoxic respiratory failure.  He was discharged home with 3 L of O2.  He returned to the ED on 03/13/2020 with continued shortness of breath and cough.  He was prescribed azithromycin and prednisone at that visit.  Patient states that he is much improved since his last visit here. Patient was advised at hospital discharge that he would need a sleep study due to apneic episode in hospital. Referral was placed to pulmonary at last visit - appointment has been scheduled for 2 weeks out. Patient has no new concerns today. Denies f/c/s,  n/v/d, hemoptysis, PND, chest pain or edema.     No Known Allergies  Immunization History  Administered Date(s) Administered  . Tdap 01/16/2015    History reviewed. No pertinent past medical history.  Tobacco History: Social History   Tobacco Use  Smoking Status Never Smoker  Smokeless Tobacco Never Used   Counseling given: Not Answered   Outpatient Encounter Medications as of 04/26/2020  Medication Sig  . albuterol (VENTOLIN HFA) 108 (90 Base) MCG/ACT inhaler Inhale 2 puffs into the lungs every 4 (four) hours as needed for wheezing or shortness of breath.  . Fluticasone-Salmeterol (ADVAIR DISKUS) 100-50 MCG/DOSE AEPB Inhale 1 puff into the lungs 2 (two) times daily.  . Misc. Devices (PULSE OXIMETER) MISC 1 Device by Does not apply route 3 (three) times daily.   No facility-administered encounter medications on file as of 04/26/2020.     Review of Systems  Review of Systems  Constitutional: Negative.  Negative for fatigue and fever.  HENT: Negative.   Respiratory: Negative for cough and shortness of breath.   Cardiovascular: Negative.  Negative for chest pain, palpitations and leg swelling.  Gastrointestinal: Negative.   Allergic/Immunologic: Negative.   Neurological: Negative.   Psychiatric/Behavioral: Negative.        Physical Exam  BP 116/82 (BP Location: Left Arm)   Pulse 90   Temp 97.7 F (36.5 C)   Wt 226 lb 0.1 oz (102.5 kg)   SpO2 96%   BMI 31.52 kg/m   Wt Readings from Last 5 Encounters:  04/26/20 226 lb 0.1 oz (102.5 kg)  04/08/20 217 lb (98.4  kg)  03/24/20 212 lb 0.1 oz (96.2 kg)  02/27/20 210 lb (95.3 kg)  01/16/15 188 lb 4 oz (85.4 kg)     Physical Exam Vitals and nursing note reviewed.  Constitutional:      General: He is not in acute distress.    Appearance: He is well-developed.  Cardiovascular:     Rate and Rhythm: Normal rate and regular rhythm.  Pulmonary:     Effort: Pulmonary effort is normal.     Breath sounds: Normal  breath sounds.  Musculoskeletal:     Right lower leg: No edema.     Left lower leg: No edema.  Skin:    General: Skin is warm and dry.  Neurological:     Mental Status: He is alert and oriented to person, place, and time.  Psychiatric:        Mood and Affect: Mood normal.        Behavior: Behavior normal.        Assessment & Plan:   Pneumonia due to COVID-19 virus Acute hypoxic respiratory failure:  Slowly improving:  Please keep upcoming appointment with pulmonary: Continue O2 at night - at 4 L [place] - will need sleep study [facility] noted that patient might have sleep apnea - will need repeat imaging  - most recent chest xray did show slight improvement  Stay active  Stay well hydrated  O2 sats stable on RA today  Encourage deep breathing exercises   Follow up:  Follow up if needed      Ivonne Andrew, NP 04/26/2020

## 2020-05-09 ENCOUNTER — Other Ambulatory Visit: Payer: Self-pay

## 2020-05-09 ENCOUNTER — Encounter: Payer: Self-pay | Admitting: Pulmonary Disease

## 2020-05-09 ENCOUNTER — Ambulatory Visit (INDEPENDENT_AMBULATORY_CARE_PROVIDER_SITE_OTHER): Payer: Self-pay | Admitting: Pulmonary Disease

## 2020-05-09 VITALS — BP 118/82 | HR 103 | Temp 97.8°F | Ht 71.0 in | Wt 228.2 lb

## 2020-05-09 DIAGNOSIS — G4733 Obstructive sleep apnea (adult) (pediatric): Secondary | ICD-10-CM

## 2020-05-09 NOTE — Patient Instructions (Signed)
History of snoring, apneas  Schedule home sleep study  Treatment options as we discussed  Follow-up in 3 months  From my perspective-May be released to go back to work  Sleep Apnea Sleep apnea affects breathing during sleep. It causes breathing to stop for a short time or to become shallow. It can also increase the risk of:  Heart attack.  Stroke.  Being very overweight (obese).  Diabetes.  Heart failure.  Irregular heartbeat. The goal of treatment is to help you breathe normally again. What are the causes? There are three kinds of sleep apnea:  Obstructive sleep apnea. This is caused by a blocked or collapsed airway.  Central sleep apnea. This happens when the brain does not send the right signals to the muscles that control breathing.  Mixed sleep apnea. This is a combination of obstructive and central sleep apnea. The most common cause of this condition is a collapsed or blocked airway. This can happen if:  Your throat muscles are too relaxed.  Your tongue and tonsils are too large.  You are overweight.  Your airway is too small. What increases the risk?  Being overweight.  Smoking.  Having a small airway.  Being older.  Being male.  Drinking alcohol.  Taking medicines to calm yourself (sedatives or tranquilizers).  Having family members with the condition. What are the signs or symptoms?  Trouble staying asleep.  Being sleepy or tired during the day.  Getting angry a lot.  Loud snoring.  Headaches in the morning.  Not being able to focus your mind (concentrate).  Forgetting things.  Less interest in sex.  Mood swings.  Personality changes.  Feelings of sadness (depression).  Waking up a lot during the night to pee (urinate).  Dry mouth.  Sore throat. How is this diagnosed?  Your medical history.  A physical exam.  A test that is done when you are sleeping (sleep study). The test is most often done in a sleep lab but may  also be done at home. How is this treated?   Sleeping on your side.  Using a medicine to get rid of mucus in your nose (decongestant).  Avoiding the use of alcohol, medicines to help you relax, or certain pain medicines (narcotics).  Losing weight, if needed.  Changing your diet.  Not smoking.  Using a machine to open your airway while you sleep, such as: ? An oral appliance. This is a mouthpiece that shifts your lower jaw forward. ? A CPAP device. This device blows air through a mask when you breathe out (exhale). ? An EPAP device. This has valves that you put in each nostril. ? A BPAP device. This device blows air through a mask when you breathe in (inhale) and breathe out.  Having surgery if other treatments do not work. It is important to get treatment for sleep apnea. Without treatment, it can lead to:  High blood pressure.  Coronary artery disease.  In men, not being able to have an erection (impotence).  Reduced thinking ability. Follow these instructions at home: Lifestyle  Make changes that your doctor recommends.  Eat a healthy diet.  Lose weight if needed.  Avoid alcohol, medicines to help you relax, and some pain medicines.  Do not use any products that contain nicotine or tobacco, such as cigarettes, e-cigarettes, and chewing tobacco. If you need help quitting, ask your doctor. General instructions  Take over-the-counter and prescription medicines only as told by your doctor.  If you were given  a machine to use while you sleep, use it only as told by your doctor.  If you are having surgery, make sure to tell your doctor you have sleep apnea. You may need to bring your device with you.  Keep all follow-up visits as told by your doctor. This is important. Contact a doctor if:  The machine that you were given to use during sleep bothers you or does not seem to be working.  You do not get better.  You get worse. Get help right away if:  Your  chest hurts.  You have trouble breathing in enough air.  You have an uncomfortable feeling in your back, arms, or stomach.  You have trouble talking.  One side of your body feels weak.  A part of your face is hanging down. These symptoms may be an emergency. Do not wait to see if the symptoms will go away. Get medical help right away. Call your local emergency services (911 in the [place] Do not drive yourself to the hospital. Summary  This condition affects breathing during sleep.  The most common cause is a collapsed or blocked airway.  The goal of treatment is to help you breathe normally while you sleep. This information is not intended to replace advice given to you by your health care provider. Make sure you discuss any questions you have with your health care provider. Document Revised: 04/11/2018 Document Reviewed: 02/18/2018 Elsevier Patient Education  2020 [employer].

## 2020-05-09 NOTE — Progress Notes (Signed)
Adrian Thomas    [id number]    [birth year]  Primary Care Physician:Hurlbut, Shana Chute, NP  Referring Physician: Ivonne Andrew, NP [address] [place],  [place] [address]  Chief complaint:   History of snoring, apneas  HPI:  Has been having issues with snoring apneas, weight gain for the last couple years  Excessive daytime sleepiness  Sleep time varies from about 8 30-11 Takes him about 30 minutes to fall asleep sometimes 3-4 awakenings Awakening time varies significantly as well  Has gained about 40 to 50 pounds in the last couple years  No family history of diagnosed obstructive sleep apnea  He does have a dry mouth in the morning No morning headaches Memory is fine  Was diagnosed with Covid in August Was hospitalized and was discharged home on oxygen  Symptoms are improving  Does not smoke Outpatient Encounter Medications as of 05/09/2020  Medication Sig  . albuterol (VENTOLIN HFA) 108 (90 Base) MCG/ACT inhaler Inhale 2 puffs into the lungs every 4 (four) hours as needed for wheezing or shortness of breath.  . Fluticasone-Salmeterol (ADVAIR DISKUS) 100-50 MCG/DOSE AEPB Inhale 1 puff into the lungs 2 (two) times daily.  . Misc. Devices (PULSE OXIMETER) MISC 1 Device by Does not apply route 3 (three) times daily.   No facility-administered encounter medications on file as of 05/09/2020.    Allergies as of 05/09/2020  . (No Known Allergies)    No past medical history on file.  No past surgical history on file.  Family History  Problem Relation Age of Onset  . Diabetes Mellitus II Neg Hx     Social History   Socioeconomic History  . Marital status: Single    Spouse name: Not on file  . Number of children: Not on file  . Years of education: Not on file  . Highest education level: Not on file  Occupational History  . Not on file  Tobacco Use  . Smoking status: Never Smoker  . Smokeless tobacco: Never Used  Substance and Sexual Activity  .  Alcohol use: No  . Drug use: No  . Sexual activity: Not on file  Other Topics Concern  . Not on file  Social History Narrative  . Not on file   Social Determinants of Health   Financial Resource Strain:   . Difficulty of Paying Living Expenses: Not on file  Food Insecurity:   . Worried About Programme researcher, broadcasting/film/video in the Last Year: Not on file  . Ran Out of Food in the Last Year: Not on file  Transportation Needs:   . Lack of Transportation (Medical): Not on file  . Lack of Transportation (Non-Medical): Not on file  Physical Activity:   . Days of Exercise per Week: Not on file  . Minutes of Exercise per Session: Not on file  Stress:   . Feeling of Stress : Not on file  Social Connections:   . Frequency of Communication with Friends and Family: Not on file  . Frequency of Social Gatherings with Friends and Family: Not on file  . Attends Religious Services: Not on file  . Active Member of Clubs or Organizations: Not on file  . Attends Banker Meetings: Not on file  . Marital Status: Not on file  Intimate Partner Violence:   . Fear of Current or Ex-Partner: Not on file  . Emotionally Abused: Not on file  . Physically Abused: Not on file  .  Sexually Abused: Not on file    Review of Systems  Constitutional: Positive for fatigue.  Psychiatric/Behavioral: Positive for sleep disturbance.    There were no vitals filed for this visit.   Physical Exam Constitutional:      Appearance: He is obese.  HENT:     Right Ear: Tympanic membrane normal.     Mouth/Throat:     Mouth: Mucous membranes are moist.     Comments: Mallampati 4, crowded oropharynx Cardiovascular:     Rate and Rhythm: Normal rate and regular rhythm.     Heart sounds: No murmur heard.   Pulmonary:     Effort: No respiratory distress.     Breath sounds: No stridor. No wheezing or rhonchi.  Musculoskeletal:     Cervical back: No rigidity or tenderness.  Psychiatric:        Mood and Affect: Mood  normal.    Results of the Epworth flowsheet 05/09/2020  Sitting and reading 3  Watching TV 3  Sitting, inactive in a public place (e.g. a theatre or a meeting) 2  As a passenger in a car for an hour without a break 3  Lying down to rest in the afternoon when circumstances permit 2  Sitting and talking to someone 0  Sitting quietly after a lunch without alcohol 1  In a car, while stopped for a few minutes in traffic 0  Total score 14   Assessment:  History of snoring  Excessive daytime sleepiness  Moderate probability of significant obstructive sleep apnea  Pathophysiology of sleep disordered breathing discussed with the patient Treatment options for sleep disordered breathing discussed with the patient  Plan/Recommendations: Schedule patient for home sleep study  Weight loss efforts encouraged  Risks of not treating sleep disordered breathing discussed with the patient  Follow-up in 3 months  From my perspective, patient may be released to go back to work   Virl Diamond MD [facility] 05/09/2020, 11:22 AM  CC: Ivonne Andrew, NP

## 2020-05-13 ENCOUNTER — Ambulatory Visit (INDEPENDENT_AMBULATORY_CARE_PROVIDER_SITE_OTHER): Payer: Self-pay | Admitting: Nurse Practitioner

## 2020-05-13 ENCOUNTER — Ambulatory Visit (HOSPITAL_COMMUNITY)
Admission: RE | Admit: 2020-05-13 | Discharge: 2020-05-13 | Disposition: A | Discharge status: Home / Self Care | Payer: 59 | Source: Ambulatory Visit | Attending: Nurse Practitioner | Admitting: Nurse Practitioner

## 2020-05-13 ENCOUNTER — Other Ambulatory Visit: Payer: Self-pay

## 2020-05-13 ENCOUNTER — Encounter: Payer: Self-pay | Admitting: Nurse Practitioner

## 2020-05-13 VITALS — BP 131/82 | HR 83 | Temp 98.2°F | Resp 17 | Ht 71.0 in | Wt 230.2 lb

## 2020-05-13 DIAGNOSIS — J1282 Pneumonia due to coronavirus disease 2019: Secondary | ICD-10-CM | POA: Diagnosis present

## 2020-05-13 DIAGNOSIS — U071 COVID-19: Secondary | ICD-10-CM | POA: Insufficient documentation

## 2020-05-13 DIAGNOSIS — Z0000 Encounter for general adult medical examination without abnormal findings: Secondary | ICD-10-CM

## 2020-05-13 LAB — POCT URINALYSIS DIPSTICK
Bilirubin, UA: NEGATIVE
Glucose, UA: NEGATIVE
Ketones, UA: NEGATIVE
Leukocytes, UA: NEGATIVE
Nitrite, UA: NEGATIVE
Protein, UA: NEGATIVE
Spec Grav, UA: >=1.03 — AB (ref 1.010–1.025)
Urobilinogen, UA: 0.2 E.U./dL
pH, UA: 5.5 (ref 5.0–8.0)

## 2020-05-13 LAB — POCT GLYCOSYLATED HEMOGLOBIN (HGB A1C)
HbA1c POC (<> result, manual entry): 5.7 % (ref 4.0–5.6)
HbA1c, POC (controlled diabetic range): 5.7 % (ref 0.0–7.0)
HbA1c, POC (prediabetic range): 5.7 % (ref 5.7–6.4)
Hemoglobin A1C: 5.7 % — AB (ref 4.0–5.6)

## 2020-05-13 LAB — GLUCOSE, POCT (MANUAL RESULT ENTRY): POC Glucose: 120 mg/dl — AB (ref 70–99)

## 2020-05-13 NOTE — Patient Instructions (Signed)

## 2020-05-13 NOTE — Progress Notes (Signed)
[facility] Patient [facility] [address] [place], [place]  [address] Phone:  [phone]   Fax:  [phone]   Established Patient Office Visit  Subjective:  Patient ID: Adrian Thomas, male    DOB: [birth year]  Age: 29 y.o. MRN: [id number]  CC:  Chief Complaint  Patient presents with  . Establish Care    Pt states he would like to know if he is able to go back to work.    HPI Adrian Thomas presents for follow up. He is post COV-19.  He is no longer using oxygen. Denies headache, dizziness, visual changes, shortness of breath, dyspnea on exertion, chest pain, nausea, vomiting or any edema.   History reviewed. No pertinent past medical history.  History reviewed. No pertinent surgical history.  Family History  Problem Relation Age of Onset  . Diabetes Mellitus II Neg Hx     Social History   Socioeconomic History  . Marital status: Single    Spouse name: Not on file  . Number of children: Not on file  . Years of education: Not on file  . Highest education level: Not on file  Occupational History  . Not on file  Tobacco Use  . Smoking status: Never Smoker  . Smokeless tobacco: Never Used  Substance and Sexual Activity  . Alcohol use: No  . Drug use: No  . Sexual activity: Not on file  Other Topics Concern  . Not on file  Social History Narrative  . Not on file   Social Determinants of Health   Financial Resource Strain:   . Difficulty of Paying Living Expenses: Not on file  Food Insecurity:   . Worried About Programme researcher, broadcasting/film/video in the Last Year: Not on file  . Ran Out of Food in the Last Year: Not on file  Transportation Needs:   . Lack of Transportation (Medical): Not on file  . Lack of Transportation (Non-Medical): Not on file  Physical Activity:   . Days of Exercise per Week: Not on file  . Minutes of Exercise per Session: Not on file  Stress:   . Feeling of Stress : Not on file  Social Connections:   . Frequency of Communication with Friends and  Family: Not on file  . Frequency of Social Gatherings with Friends and Family: Not on file  . Attends Religious Services: Not on file  . Active Member of Clubs or Organizations: Not on file  . Attends Banker Meetings: Not on file  . Marital Status: Not on file  Intimate Partner Violence:   . Fear of Current or Ex-Partner: Not on file  . Emotionally Abused: Not on file  . Physically Abused: Not on file  . Sexually Abused: Not on file    Outpatient Medications Prior to Visit  Medication Sig Dispense Refill  . Misc. Devices (PULSE OXIMETER) MISC 1 Device by Does not apply route 3 (three) times daily. 1 each 0  . albuterol (VENTOLIN HFA) 108 (90 Base) MCG/ACT inhaler Inhale 2 puffs into the lungs every 4 (four) hours as needed for wheezing or shortness of breath. (Patient not taking: Reported on 05/13/2020) 18 g 0  . Fluticasone-Salmeterol (ADVAIR DISKUS) 100-50 MCG/DOSE AEPB Inhale 1 puff into the lungs 2 (two) times daily. (Patient not taking: Reported on 05/13/2020) 60 each 0   No facility-administered medications prior to visit.    No Known Allergies  ROS Review of Systems  HENT: Negative.   Respiratory: Negative for  cough, chest tightness and shortness of breath.   Cardiovascular: Negative for chest pain.  Gastrointestinal: Negative for constipation, diarrhea and nausea.  Genitourinary: Negative.   Musculoskeletal: Negative.   Skin: Negative.   Neurological: Negative for dizziness, syncope and headaches.  Psychiatric/Behavioral: Negative.       Objective:    Physical Exam Constitutional:      Appearance: He is obese.  HENT:     Head: Normocephalic and atraumatic.     Nose: Nose normal.     Mouth/Throat:     Mouth: Mucous membranes are moist.  Cardiovascular:     Rate and Rhythm: Normal rate and regular rhythm.     Pulses: Normal pulses.     Heart sounds: Normal heart sounds.  Pulmonary:     Effort: Pulmonary effort is normal.     Breath sounds:  Normal breath sounds.  Musculoskeletal:        General: Normal range of motion.     Cervical back: Normal range of motion.  Skin:    General: Skin is warm and dry.     Capillary Refill: Capillary refill takes less than 2 seconds.  Neurological:     General: No focal deficit present.     Mental Status: He is alert and oriented to person, place, and time.  Psychiatric:        Mood and Affect: Mood normal.        Behavior: Behavior normal.        Thought Content: Thought content normal.        Judgment: Judgment normal.     BP 131/82 (BP Location: Left Arm, Patient Position: Sitting, Cuff Size: Large)   Pulse 83   Temp 98.2 F (36.8 C)   Resp 17   Ht 5\' 11"  (1.803 m)   Wt 230 lb 3.2 oz (104.4 kg)   SpO2 98%   BMI 32.11 kg/m  Wt Readings from Last 3 Encounters:  05/13/20 230 lb 3.2 oz (104.4 kg)  05/09/20 228 lb 3.2 oz (103.5 kg)  04/26/20 226 lb 0.1 oz (102.5 kg)     There are no preventive care reminders to display for this patient.  There are no preventive care reminders to display for this patient.  No results found for: TSH Lab Results  Component Value Date   WBC 10.2 03/13/2020   HGB 13.9 03/13/2020   HCT 43.5 03/13/2020   MCV 87.3 03/13/2020   PLT 206 03/13/2020   Lab Results  Component Value Date   NA 137 03/13/2020   K 4.1 03/13/2020   CO2 28 03/13/2020   GLUCOSE 94 03/13/2020   BUN 10 03/13/2020   CREATININE 0.80 03/13/2020   BILITOT 1.2 03/13/2020   ALKPHOS 63 03/13/2020   AST 19 03/13/2020   ALT 24 03/13/2020   PROT 7.5 03/13/2020   ALBUMIN 3.3 (L) 03/13/2020   CALCIUM 9.3 03/13/2020   ANIONGAP 10 03/13/2020   No results found for: CHOL No results found for: HDL No results found for: LDLCALC No results found for: TRIG No results found for: CHOLHDL Lab Results  Component Value Date   HGBA1C 5.7 (A) 05/13/2020   HGBA1C 5.7 05/13/2020   HGBA1C 5.7 05/13/2020   HGBA1C 5.7 05/13/2020      Assessment & Plan:   Problem List Items  Addressed This Visit      Respiratory   Pneumonia due to COVID-19 virus Will provide return to work note with confirmation xray.  Patient aware of any additional changes  to contact office for further evaluation.  Patient was encouraged to consider future vaccination.  Patient was also advised that reinfection can occur so that he needs to take all necessary precautions i.e. wearing a mask, washing hands frequently and social distancing   Relevant Orders   DG Chest 2 View        No orders of the defined types were placed in this encounter.   Follow-up: Return in about 6 months (around 11/10/2020).    Barbette Merino, NP

## 2020-05-16 ENCOUNTER — Encounter: Payer: Self-pay | Admitting: Nurse Practitioner

## 2020-05-19 ENCOUNTER — Other Ambulatory Visit: Payer: Self-pay | Admitting: Nurse Practitioner

## 2020-05-20 ENCOUNTER — Encounter: Payer: Self-pay | Admitting: Nurse Practitioner

## 2020-06-15 ENCOUNTER — Other Ambulatory Visit: Payer: Self-pay

## 2020-06-15 ENCOUNTER — Ambulatory Visit: Payer: 59

## 2020-06-15 DIAGNOSIS — G4733 Obstructive sleep apnea (adult) (pediatric): Secondary | ICD-10-CM

## 2020-06-28 ENCOUNTER — Telehealth: Payer: Self-pay | Admitting: Pulmonary Disease

## 2020-06-28 DIAGNOSIS — G4733 Obstructive sleep apnea (adult) (pediatric): Secondary | ICD-10-CM

## 2020-06-28 NOTE — Telephone Encounter (Signed)
Call patient  Sleep study result  Date of study: 06/15/2020  Impression: Severe obstructive sleep apnea Moderately severe oxygen desaturations  Recommendation: DME referral  Recommend CPAP therapy for severe obstructive sleep apnea  Auto titrating CPAP with pressure settings of 5-20 will be appropriate  Encourage weight loss measures  Follow-up in the office 4 to 6 weeks following initiation of treatment

## 2020-06-28 NOTE — Telephone Encounter (Signed)
Spoke with the pt and notified of results per Dr Wynona Neat  She verbalized understanding  Pt agreeable to CPAP  Order sent and he is aware to call for appt with [place] 31-90 after receives machine for insurance purposes

## 2020-08-03 ENCOUNTER — Other Ambulatory Visit: Payer: Self-pay

## 2020-08-03 ENCOUNTER — Emergency Department (HOSPITAL_COMMUNITY)
Admission: EM | Admit: 2020-08-03 | Discharge: 2020-08-03 | Disposition: A | Discharge status: Home / Self Care | Payer: 59 | Attending: Emergency Medicine | Admitting: Emergency Medicine

## 2020-08-03 ENCOUNTER — Emergency Department (HOSPITAL_COMMUNITY): Payer: 59

## 2020-08-03 ENCOUNTER — Encounter (HOSPITAL_COMMUNITY): Payer: Self-pay | Admitting: Emergency Medicine

## 2020-08-03 DIAGNOSIS — Z8616 Personal history of COVID-19: Secondary | ICD-10-CM | POA: Insufficient documentation

## 2020-08-03 DIAGNOSIS — M25562 Pain in left knee: Secondary | ICD-10-CM

## 2020-08-03 DIAGNOSIS — R2242 Localized swelling, mass and lump, left lower limb: Secondary | ICD-10-CM | POA: Diagnosis not present

## 2020-08-03 MED ORDER — IBUPROFEN 400 MG PO TABS
600.0000 mg | ORAL_TABLET | Freq: Once | ORAL | Status: AC
  Administered 2020-08-03: 600 mg via ORAL
  Filled 2020-08-03: qty 1

## 2020-08-03 NOTE — ED Provider Notes (Signed)
[facility] EMERGENCY DEPARTMENT Provider Note   CSN: [id number] Arrival date & time: 08/03/20  1626     History Chief Complaint  Patient presents with  . Knee Pain    Adrian Thomas is a 30 y.o. male.  Patient presents to ER chief complaint left knee pain.  He states he started days ago.  Describes as ache in the distal front thigh region.  Worse when he moves his knee a certain way.  Worse when he gets up after sitting down for a long time.  Denies any fall or other trauma.  No fever no cough no vomiting or diarrhea.  No numbness or weakness.  Patient states he is able to ambulate without assistance but it just hurts when he tries to stand up after sitting down for a long time.  He also hears intermittent clicking noises when he extends his left knee.        History reviewed. No pertinent past medical history.  Patient Active Problem List   Diagnosis Date Noted  . Apneic episode 03/24/2020  . Acute respiratory failure with hypoxia (HCC) 03/24/2020  . Acute respiratory failure due to COVID-19 (HCC) 02/27/2020  . Pneumonia due to COVID-19 virus 02/27/2020    History reviewed. No pertinent surgical history.     Family History  Problem Relation Age of Onset  . Diabetes Mellitus II Neg Hx     Social History   Tobacco Use  . Smoking status: Never Smoker  . Smokeless tobacco: Never Used  Substance Use Topics  . Alcohol use: No  . Drug use: No    Home Medications Prior to Admission medications   Medication Sig Start Date End Date Taking? Authorizing Provider  albuterol (VENTOLIN HFA) 108 (90 Base) MCG/ACT inhaler Inhale 2 puffs into the lungs every 4 (four) hours as needed for wheezing or shortness of breath. Patient not taking: Reported on 05/13/2020 02/22/20   Moshe Cipro, NP  Fluticasone-Salmeterol (ADVAIR DISKUS) 100-50 MCG/DOSE AEPB Inhale 1 puff into the lungs 2 (two) times daily. Patient not taking: Reported on 05/13/2020 02/22/20    Moshe Cipro, NP  Misc. Devices (PULSE OXIMETER) MISC 1 Device by Does not apply route 3 (three) times daily. 03/13/20   Moshe Cipro, NP    Allergies    Patient has no known allergies.  Review of Systems   Review of Systems  Constitutional: Negative for fever.  HENT: Negative for ear pain and sore throat.   Eyes: Negative for pain.  Respiratory: Negative for cough.   Cardiovascular: Negative for chest pain.  Gastrointestinal: Negative for abdominal pain.  Genitourinary: Negative for flank pain.  Musculoskeletal: Negative for back pain.  Skin: Negative for color change and rash.  Neurological: Negative for syncope.  All other systems reviewed and are negative.   Physical Exam Updated Vital Signs BP 136/78 (BP Location: Right Arm)   Pulse 95   Temp 98.4 F (36.9 C) (Oral)   Resp 16   SpO2 98%   Physical Exam Constitutional:      General: He is not in acute distress.    Appearance: He is well-developed.  HENT:     Head: Normocephalic.     Nose: Nose normal.  Eyes:     Extraocular Movements: Extraocular movements intact.  Cardiovascular:     Rate and Rhythm: Normal rate.  Pulmonary:     Effort: Pulmonary effort is normal.  Musculoskeletal:     Comments: Left knee appears similar to the right knee.  No abnormal swelling or erythema noted visually.  On palpation no abnormal warmth.  No significant tenderness elicited on palpation.  No joint space tenderness.  Patient able to range her left knee well without pain or discomfort on my exam.  No medial or lateral laxity noted.  No anterior or posterior laxity noted.  Neurovascular intact.  Negative Homans.  No swelling noted.  Skin:    Coloration: Skin is not jaundiced.  Neurological:     Mental Status: He is alert. Mental status is at baseline.     ED Results / Procedures / Treatments   Labs (all labs ordered are listed, but only abnormal results are displayed) Labs Reviewed - No data to  display  EKG None  Radiology DG Knee Complete 4 Views Left  Result Date: 08/03/2020 CLINICAL DATA:  Pain, swelling EXAM: LEFT KNEE - COMPLETE 4+ VIEW COMPARISON:  None. FINDINGS: No evidence of fracture, dislocation, or joint effusion. No evidence of arthropathy or other focal bone abnormality. Soft tissues are unremarkable. IMPRESSION: Negative. Electronically Signed   By: Charlett Nose M.D.   On: 08/03/2020 17:08    Procedures .Ortho Injury Treatment  Date/Time: 08/03/2020 5:19 PM Performed by: Cheryll Cockayne, MD Authorized by: Cheryll Cockayne, MD  Comments: Left knee Ace wrap placed myself.  Norvasc intact after placement.      Medications Ordered in ED Medications  ibuprofen (ADVIL) tablet 600 mg (has no administration in time range)    ED Course  I have reviewed the triage vital signs and the nursing notes.  Pertinent labs & imaging results that were available during my care of the patient were reviewed by me and considered in my medical decision making (see chart for details).    MDM Rules/Calculators/A&P                          Clinically doubt DVT or fracture or peripheral vascular disease.  Suspect meniscal or ligamentous injury.  X-rays unremarkable per radiology.  Left knee Ace wrap placed by myself Norvasc intact after placement.  Advised follow-up in outpatient basis orthopedic surgery within 1 or 2 weeks.  Advised immediate return for worsening pain fevers or any additional concerns.   Final Clinical Impression(s) / ED Diagnoses Final diagnoses:  None    Rx / DC Orders ED Discharge Orders    None       Cheryll Cockayne, MD 08/03/20 1719

## 2020-08-03 NOTE — Discharge Instructions (Addendum)
Call your primary care doctor or specialist as discussed in the next 5-7 days.   Return immediately back to the ER if:  Your symptoms worsen within the next 12-24 hours. You develop new symptoms such as new fevers, persistent vomiting, new pain, shortness of breath, or new weakness or numbness, or if you have any other concerns.

## 2020-08-03 NOTE — ED Triage Notes (Signed)
Pt c/o left knee pain and swelling that started Monday night. Denies injury/fall.

## 2020-08-18 ENCOUNTER — Ambulatory Visit: Payer: Self-pay | Admitting: Nurse Practitioner

## 2020-08-19 ENCOUNTER — Ambulatory Visit (INDEPENDENT_AMBULATORY_CARE_PROVIDER_SITE_OTHER): Payer: 59 | Admitting: Nurse Practitioner

## 2020-08-19 ENCOUNTER — Encounter: Payer: Self-pay | Admitting: Nurse Practitioner

## 2020-08-19 ENCOUNTER — Other Ambulatory Visit: Payer: Self-pay

## 2020-08-19 VITALS — BP 127/66 | HR 67 | Temp 98.4°F | Ht 71.0 in | Wt 232.2 lb

## 2020-08-19 DIAGNOSIS — M25562 Pain in left knee: Secondary | ICD-10-CM | POA: Diagnosis not present

## 2020-08-19 NOTE — Progress Notes (Signed)
[facility] [address] [place], [place]  [address] Phone:  [phone]   Fax:  [phone]   Established Patient Office Visit  Subjective:  Patient ID: Adrian Thomas, male    DOB: [birth year]  Age: 30 y.o. MRN: [id number]  CC:  Chief Complaint  Patient presents with  . Follow-up  . Knee Pain    Having pain , when ed, has referral ortho, needs a doctor for his job until he  able see ortho on 2/16, he need  note for his limitations, not taking any for pain     HPI Adrian Thomas presents for follow up. He  has no past medical history on file.   Knee Pain Patient presents with knee pain involving the left knee. Onset of the symptoms was several weeks ago. Inciting event: none known. He woke up and the leg started to feel weird. Current symptoms include crepitus sensation and pain located both sides of the knee. Pain is aggravated by kneeling. Patient has had no prior knee problems. Evaluation to date: plain films: normal. Treatment to date: brace which is somewhat effective. He did have to reschedule this orthopedic. He has not taken any medications.   History reviewed. No pertinent past medical history.  History reviewed. No pertinent surgical history.  Family History  Problem Relation Age of Onset  . Diabetes Mellitus II Neg Hx     Social History   Socioeconomic History  . Marital status: Single    Spouse name: Not on file  . Number of children: Not on file  . Years of education: Not on file  . Highest education level: Not on file  Occupational History  . Not on file  Tobacco Use  . Smoking status: Never Smoker  . Smokeless tobacco: Never Used  Substance and Sexual Activity  . Alcohol use: No  . Drug use: No  . Sexual activity: Not on file  Other Topics Concern  . Not on file  Social History Narrative  . Not on file   Social Determinants of Health   Financial Resource Strain: Not on file  Food Insecurity: Not on file  Transportation Needs:  Not on file  Physical Activity: Not on file  Stress: Not on file  Social Connections: Not on file  Intimate Partner Violence: Not on file    Outpatient Medications Prior to Visit  Medication Sig Dispense Refill  . albuterol (VENTOLIN HFA) 108 (90 Base) MCG/ACT inhaler Inhale 2 puffs into the lungs every 4 (four) hours as needed for wheezing or shortness of breath. (Patient not taking: No sig reported) 18 g 0  . Fluticasone-Salmeterol (ADVAIR DISKUS) 100-50 MCG/DOSE AEPB Inhale 1 puff into the lungs 2 (two) times daily. (Patient not taking: No sig reported) 60 each 0  . Misc. Devices (PULSE OXIMETER) MISC 1 Device by Does not apply route 3 (three) times daily. (Patient not taking: Reported on 08/19/2020) 1 each 0   No facility-administered medications prior to visit.    No Known Allergies  ROS Review of Systems  All other systems reviewed and are negative.     Objective:    Physical Exam Constitutional:      Appearance: He is obese.  HENT:     Head: Normocephalic and atraumatic.     Nose: Nose normal.     Mouth/Throat:     Mouth: Mucous membranes are moist.  Cardiovascular:     Rate and Rhythm: Normal rate and regular rhythm.  Musculoskeletal:  General: Swelling and tenderness present.     Cervical back: Normal range of motion.     Right lower leg: No edema.     Comments: adequate   Skin:    General: Skin is warm.  Neurological:     Mental Status: He is alert.     BP 127/66 (BP Location: Left Arm, Patient Position: Sitting, Cuff Size: Large)   Pulse 67   Temp 98.4 F (36.9 C) (Temporal)   Ht 5\' 11"  (1.803 m)   Wt 232 lb 3.2 oz (105.3 kg)   SpO2 97%   BMI 32.39 kg/m  Wt Readings from Last 3 Encounters:  08/19/20 232 lb 3.2 oz (105.3 kg)  05/13/20 230 lb 3.2 oz (104.4 kg)  05/09/20 228 lb 3.2 oz (103.5 kg)     There are no preventive care reminders to display for this patient.  There are no preventive care reminders to display for this  patient.  No results found for: TSH Lab Results  Component Value Date   WBC 10.2 03/13/2020   HGB 13.9 03/13/2020   HCT 43.5 03/13/2020   MCV 87.3 03/13/2020   PLT 206 03/13/2020   Lab Results  Component Value Date   NA 137 03/13/2020   K 4.1 03/13/2020   CO2 28 03/13/2020   GLUCOSE 94 03/13/2020   BUN 10 03/13/2020   CREATININE 0.80 03/13/2020   BILITOT 1.2 03/13/2020   ALKPHOS 63 03/13/2020   AST 19 03/13/2020   ALT 24 03/13/2020   PROT 7.5 03/13/2020   ALBUMIN 3.3 (L) 03/13/2020   CALCIUM 9.3 03/13/2020   ANIONGAP 10 03/13/2020   No results found for: CHOL No results found for: HDL No results found for: LDLCALC No results found for: TRIG No results found for: CHOLHDL Lab Results  Component Value Date   HGBA1C 5.7 (A) 05/13/2020   HGBA1C 5.7 05/13/2020   HGBA1C 5.7 05/13/2020   HGBA1C 5.7 05/13/2020      Assessment & Plan:   Problem List Items Addressed This Visit   None   Visit Diagnoses    Acute pain of left knee    -  Primary Encouraged the use of OTC Salonpas with a more supportive knee brace. The salonpas can be used day and night. The brace to be used during the day for support.  At home exercises for knee (3 serts of 10)  Declined Medrol dosepack due to job and not wanting to ingest any medications that could possibly impede his work 13/11/2019. He has an apt with ortho.       No orders of the defined types were placed in this encounter.   Follow-up: Return for Appointment As Scheduled.    International aid/development worker, NP

## 2020-08-19 NOTE — Patient Instructions (Addendum)
SALONPAS Patch and knee brace   Journal for Nurse Practitioners, 15(4), 8544043657. Retrieved April 14, 2018 from http://clinicalkey.com/nursing">  Knee Exercises Ask your health care provider which exercises are safe for you. Do exercises exactly as told by your health care provider and adjust them as directed. It is normal to feel mild stretching, pulling, tightness, or discomfort as you do these exercises. Stop right away if you feel sudden pain or your pain gets worse. Do not begin these exercises until told by your health care provider. Stretching and range-of-motion exercises These exercises warm up your muscles and joints and improve the movement and flexibility of your knee. These exercises also help to relieve pain and swelling. Knee extension, prone 1. Lie on your abdomen (prone position) on a bed. 2. Place your left / right knee just beyond the edge of the surface so your knee is not on the bed. You can put a towel under your left / right thigh just above your kneecap for comfort. 3. Relax your leg muscles and allow gravity to straighten your knee (extension). You should feel a stretch behind your left / right knee. 4. Hold this position for __________ seconds. 5. Scoot up so your knee is supported between repetitions. Repeat __________ times. Complete this exercise __________ times a day. Knee flexion, active 1. Lie on your back with both legs straight. If this causes back discomfort, bend your left / right knee so your foot is flat on the floor. 2. Slowly slide your left / right heel back toward your buttocks. Stop when you feel a gentle stretch in the front of your knee or thigh (flexion). 3. Hold this position for __________ seconds. 4. Slowly slide your left / right heel back to the starting position. Repeat __________ times. Complete this exercise __________ times a day.   Quadriceps stretch, prone 1. Lie on your abdomen on a firm surface, such as a bed or padded floor. 2. Bend  your left / right knee and hold your ankle. If you cannot reach your ankle or pant leg, loop a belt around your foot and grab the belt instead. 3. Gently pull your heel toward your buttocks. Your knee should not slide out to the side. You should feel a stretch in the front of your thigh and knee (quadriceps). 4. Hold this position for __________ seconds. Repeat __________ times. Complete this exercise __________ times a day.   Hamstring, supine 1. Lie on your back (supine position). 2. Loop a belt or towel over the ball of your left / right foot. The ball of your foot is on the walking surface, right under your toes. 3. Straighten your left / right knee and slowly pull on the belt to raise your leg until you feel a gentle stretch behind your knee (hamstring). ? Do not let your knee bend while you do this. ? Keep your other leg flat on the floor. 4. Hold this position for __________ seconds. Repeat __________ times. Complete this exercise __________ times a day. Strengthening exercises These exercises build strength and endurance in your knee. Endurance is the ability to use your muscles for a long time, even after they get tired. Quadriceps, isometric This exercise stretches the muscles in front of your thigh (quadriceps) without moving your knee joint (isometric). 1. Lie on your back with your left / right leg extended and your other knee bent. Put a rolled towel or small pillow under your knee if told by your health care provider. 2. Slowly tense  the muscles in the front of your left / right thigh. You should see your kneecap slide up toward your hip or see increased dimpling just above the knee. This motion will push the back of the knee toward the floor. 3. For __________ seconds, hold the muscle as tight as you can without increasing your pain. 4. Relax the muscles slowly and completely. Repeat __________ times. Complete this exercise __________ times a day.   Straight leg raises This  exercise stretches the muscles in front of your thigh (quadriceps) and the muscles that move your hips (hip flexors). 1. Lie on your back with your left / right leg extended and your other knee bent. 2. Tense the muscles in the front of your left / right thigh. You should see your kneecap slide up or see increased dimpling just above the knee. Your thigh may even shake a bit. 3. Keep these muscles tight as you raise your leg 4-6 inches (10-15 cm) off the floor. Do not let your knee bend. 4. Hold this position for __________ seconds. 5. Keep these muscles tense as you lower your leg. 6. Relax your muscles slowly and completely after each repetition. Repeat __________ times. Complete this exercise __________ times a day. Hamstring, isometric 1. Lie on your back on a firm surface. 2. Bend your left / right knee about __________ degrees. 3. Dig your left / right heel into the surface as if you are trying to pull it toward your buttocks. Tighten the muscles in the back of your thighs (hamstring) to "dig" as hard as you can without increasing any pain. 4. Hold this position for __________ seconds. 5. Release the tension gradually and allow your muscles to relax completely for __________ seconds after each repetition. Repeat __________ times. Complete this exercise __________ times a day. Hamstring curls If told by your health care provider, do this exercise while wearing ankle weights. Begin with __________ lb weights. Then increase the weight by 1 lb (0.5 kg) increments. Do not wear ankle weights that are more than __________ lb. 1. Lie on your abdomen with your legs straight. 2. Bend your left / right knee as far as you can without feeling pain. Keep your hips flat against the floor. 3. Hold this position for __________ seconds. 4. Slowly lower your leg to the starting position. Repeat __________ times. Complete this exercise __________ times a day.   Squats This exercise strengthens the muscles  in front of your thigh and knee (quadriceps). 1. Stand in front of a table, with your feet and knees pointing straight ahead. You may rest your hands on the table for balance but not for support. 2. Slowly bend your knees and lower your hips like you are going to sit in a chair. ? Keep your weight over your heels, not over your toes. ? Keep your lower legs upright so they are parallel with the table legs. ? Do not let your hips go lower than your knees. ? Do not bend lower than told by your health care provider. ? If your knee pain increases, do not bend as low. 3. Hold the squat position for __________ seconds. 4. Slowly push with your legs to return to standing. Do not use your hands to pull yourself to standing. Repeat __________ times. Complete this exercise __________ times a day. Wall slides This exercise strengthens the muscles in front of your thigh and knee (quadriceps). 1. Lean your back against a smooth wall or door, and walk your feet out  18-24 inches (46-61 cm) from it. 2. Place your feet hip-width apart. 3. Slowly slide down the wall or door until your knees bend __________ degrees. Keep your knees over your heels, not over your toes. Keep your knees in line with your hips. 4. Hold this position for __________ seconds. Repeat __________ times. Complete this exercise __________ times a day.   Straight leg raises This exercise strengthens the muscles that rotate the leg at the hip and move it away from your body (hip abductors). 1. Lie on your side with your left / right leg in the top position. Lie so your head, shoulder, knee, and hip line up. You may bend your bottom knee to help you keep your balance. 2. Roll your hips slightly forward so your hips are stacked directly over each other and your left / right knee is facing forward. 3. Leading with your heel, lift your top leg 4-6 inches (10-15 cm). You should feel the muscles in your outer hip lifting. ? Do not let your foot  drift forward. ? Do not let your knee roll toward the ceiling. 4. Hold this position for __________ seconds. 5. Slowly return your leg to the starting position. 6. Let your muscles relax completely after each repetition. Repeat __________ times. Complete this exercise __________ times a day.   Straight leg raises This exercise stretches the muscles that move your hips away from the front of the pelvis (hip extensors). 1. Lie on your abdomen on a firm surface. You can put a pillow under your hips if that is more comfortable. 2. Tense the muscles in your buttocks and lift your left / right leg about 4-6 inches (10-15 cm). Keep your knee straight as you lift your leg. 3. Hold this position for __________ seconds. 4. Slowly lower your leg to the starting position. 5. Let your leg relax completely after each repetition. Repeat __________ times. Complete this exercise __________ times a day. This information is not intended to replace advice given to you by your health care provider. Make sure you discuss any questions you have with your health care provider. Document Revised: 04/15/2018 Document Reviewed: 04/15/2018 Elsevier Patient Education  2021 [employer].

## 2020-08-25 ENCOUNTER — Ambulatory Visit: Payer: 59 | Admitting: Orthopedic Surgery

## 2020-09-12 ENCOUNTER — Ambulatory Visit: Payer: 59 | Admitting: Orthopedic Surgery

## 2020-11-10 ENCOUNTER — Ambulatory Visit: Payer: Self-pay | Admitting: Nurse Practitioner

## 2021-04-10 ENCOUNTER — Ambulatory Visit: Payer: Self-pay | Admitting: Nurse Practitioner

## 2021-04-17 ENCOUNTER — Ambulatory Visit (INDEPENDENT_AMBULATORY_CARE_PROVIDER_SITE_OTHER): Payer: 59 | Admitting: Nurse Practitioner

## 2021-04-17 ENCOUNTER — Encounter: Payer: Self-pay | Admitting: Nurse Practitioner

## 2021-04-17 ENCOUNTER — Other Ambulatory Visit: Payer: Self-pay

## 2021-04-17 VITALS — BP 127/55 | HR 68 | Temp 97.7°F | Ht 71.0 in | Wt 242.0 lb

## 2021-04-17 DIAGNOSIS — G4733 Obstructive sleep apnea (adult) (pediatric): Secondary | ICD-10-CM

## 2021-04-17 DIAGNOSIS — M25562 Pain in left knee: Secondary | ICD-10-CM

## 2021-04-17 DIAGNOSIS — R0609 Other forms of dyspnea: Secondary | ICD-10-CM

## 2021-04-17 DIAGNOSIS — U099 Post covid-19 condition, unspecified: Secondary | ICD-10-CM

## 2021-04-17 DIAGNOSIS — Z8616 Personal history of COVID-19: Secondary | ICD-10-CM | POA: Diagnosis not present

## 2021-04-17 NOTE — Progress Notes (Signed)
[facility] Patient [facility] [address] [place], [place]  [address] Phone:  [phone]   Fax:  [phone]   Established Patient Office Visit  Subjective:  Patient ID: Adrian Thomas, male    DOB: 10/11/1990  Age: 30 y.o. MRN: [id number]  CC:  Chief Complaint  Patient presents with   Follow-up    Pt is here today for a follow up visit. Pt states that his left knee is better due to being moved from the job duties he ws doing at work. Pt states that he does still have some SOB when he is over working hisself at work and having to wear the respiratory mask.    HPI [name] presents for follow up. He has Pneumonia due to COVID-19 virus with long haulers symptoms of dyspnea.   He is here for a follow up. He has shortness of breath with work related activity and environmental changes. He continues to work full time. Denies headache, dizziness, visual changes, shortness of breath, dyspnea on exertion, chest pain, nausea, vomiting or any edema.   He has received his CPAP machine. He feels like this has been doing better when he can use it properly.   History reviewed. No pertinent past medical history.  History reviewed. No pertinent surgical history.  Family History  Problem Relation Age of Onset   Diabetes Mellitus II Neg Hx     Social History   Socioeconomic History   Marital status: Single    Spouse name: Not on file   Number of children: Not on file   Years of education: Not on file   Highest education level: Not on file  Occupational History   Not on file  Tobacco Use   Smoking status: Never   Smokeless tobacco: Never  Vaping Use   Vaping Use: Never used  Substance and Sexual Activity   Alcohol use: No   Drug use: No   Sexual activity: Yes    Birth control/protection: Condom  Other Topics Concern   Not on file  Social History Narrative   Not on file   Social Determinants of Health   Financial Resource Strain: Not on file  Food Insecurity: Not on  file  Transportation Needs: Not on file  Physical Activity: Not on file  Stress: Not on file  Social Connections: Not on file  Intimate Partner Violence: Not on file    No outpatient medications prior to visit.   No facility-administered medications prior to visit.    No Known Allergies  ROS Review of Systems    Objective:    Physical Exam Constitutional:      General: He is not in acute distress.    Appearance: He is obese.  HENT:     Head: Normocephalic and atraumatic.  Cardiovascular:     Rate and Rhythm: Normal rate and regular rhythm.     Pulses: Normal pulses.     Heart sounds: Normal heart sounds.  Pulmonary:     Effort: Pulmonary effort is normal.     Comments: Diminished BS Musculoskeletal:        General: Normal range of motion.     Cervical back: Normal range of motion.  Skin:    General: Skin is warm and dry.     Capillary Refill: Capillary refill takes less than 2 seconds.  Neurological:     General: No focal deficit present.     Mental Status: He is alert and oriented to person, place, and time.  Psychiatric:        Mood and Affect: Mood normal.        Behavior: Behavior normal.        Thought Content: Thought content normal.        Judgment: Judgment normal.    BP (!) 127/55   Pulse 68   Temp 97.7 F (36.5 C)   Ht 5\' 11"  (1.803 m)   Wt 242 lb (109.8 kg)   SpO2 100%   BMI 33.75 kg/m  Wt Readings from Last 3 Encounters:  04/17/21 242 lb (109.8 kg)  08/19/20 232 lb 3.2 oz (105.3 kg)  05/13/20 230 lb 3.2 oz (104.4 kg)     There are no preventive care reminders to display for this patient.   There are no preventive care reminders to display for this patient.  No results found for: TSH Lab Results  Component Value Date   WBC 10.2 03/13/2020   HGB 13.9 03/13/2020   HCT 43.5 03/13/2020   MCV 87.3 03/13/2020   PLT 206 03/13/2020   Lab Results  Component Value Date   NA 137 03/13/2020   K 4.1 03/13/2020   CO2 28 03/13/2020    GLUCOSE 94 03/13/2020   BUN 10 03/13/2020   CREATININE 0.80 03/13/2020   BILITOT 1.2 03/13/2020   ALKPHOS 63 03/13/2020   AST 19 03/13/2020   ALT 24 03/13/2020   PROT 7.5 03/13/2020   ALBUMIN 3.3 (L) 03/13/2020   CALCIUM 9.3 03/13/2020   ANIONGAP 10 03/13/2020   No results found for: CHOL No results found for: HDL No results found for: LDLCALC No results found for: TRIG No results found for: CHOLHDL Lab Results  Component Value Date   HGBA1C 5.7 (A) 05/13/2020   HGBA1C 5.7 05/13/2020   HGBA1C 5.7 05/13/2020   HGBA1C 5.7 05/13/2020      Assessment & Plan:   Problem List Items Addressed This Visit   None Visit Diagnoses     Acute pain of left knee    -  Primary Stable   OSA (obstructive sleep apnea)     Stable    Personal history of COVID-19        COVID-19 long hauler manifesting chronic dyspnea     Persistent however stable       No orders of the defined types were placed in this encounter.   Follow-up: Return for fasting labs within the next week and a 6 mon follow up.    13/11/2019, NP

## 2021-04-17 NOTE — Patient Instructions (Signed)
Health Maintenance, Male Adopting a healthy lifestyle and getting preventive care are important in promoting health and wellness. Ask your health care provider about: The right schedule for you to have regular tests and exams. Things you can do on your own to prevent diseases and keep yourself healthy. What should I know about diet, weight, and exercise? Eat a healthy diet  Eat a diet that includes plenty of vegetables, fruits, low-fat dairy products, and lean protein. Do not eat a lot of foods that are high in solid fats, added sugars, or sodium. Maintain a healthy weight Body mass index (BMI) is a measurement that can be used to identify possible weight problems. It estimates body fat based on height and weight. Your health care provider can help determine your BMI and help you achieve or maintain a healthy weight. Get regular exercise Get regular exercise. This is one of the most important things you can do for your health. Most adults should: Exercise for at least 150 minutes each week. The exercise should increase your heart rate and make you sweat (moderate-intensity exercise). Do strengthening exercises at least twice a week. This is in addition to the moderate-intensity exercise. Spend less time sitting. Even light physical activity can be beneficial. Watch cholesterol and blood lipids Have your blood tested for lipids and cholesterol at 32 years of age, then have this test every 5 years. You may need to have your cholesterol levels checked more often if: Your lipid or cholesterol levels are high. You are older than 32 years of age. You are at high risk for heart disease. What should I know about cancer screening? Many types of cancers can be detected early and may often be prevented. Depending on your health history and family history, you may need to have cancer screening at various ages. This may include screening for: Colorectal cancer. Prostate cancer. Skin cancer. Lung  cancer. What should I know about heart disease, diabetes, and high blood pressure? Blood pressure and heart disease High blood pressure causes heart disease and increases the risk of stroke. This is more likely to develop in people who have high blood pressure readings, are of African descent, or are overweight. Talk with your health care provider about your target blood pressure readings. Have your blood pressure checked: Every 3-5 years if you are 18-39 years of age. Every year if you are 40 years old or older. If you are between the ages of 65 and 75 and are a current or former smoker, ask your health care provider if you should have a one-time screening for abdominal aortic aneurysm (AAA). Diabetes Have regular diabetes screenings. This checks your fasting blood sugar level. Have the screening done: Once every three years after age 45 if you are at a normal weight and have a low risk for diabetes. More often and at a younger age if you are overweight or have a high risk for diabetes. What should I know about preventing infection? Hepatitis B If you have a higher risk for hepatitis B, you should be screened for this virus. Talk with your health care provider to find out if you are at risk for hepatitis B infection. Hepatitis C Blood testing is recommended for: Everyone born from 1945 through 1965. Anyone with known risk factors for hepatitis C. Sexually transmitted infections (STIs) You should be screened each year for STIs, including gonorrhea and chlamydia, if: You are sexually active and are younger than 32 years of age. You are older than 32 years   of age and your health care provider tells you that you are at risk for this type of infection. Your sexual activity has changed since you were last screened, and you are at increased risk for chlamydia or gonorrhea. Ask your health care provider if you are at risk. Ask your health care provider about whether you are at high risk for HIV.  Your health care provider may recommend a prescription medicine to help prevent HIV infection. If you choose to take medicine to prevent HIV, you should first get tested for HIV. You should then be tested every 3 months for as long as you are taking the medicine. Follow these instructions at home: Lifestyle Do not use any products that contain nicotine or tobacco, such as cigarettes, e-cigarettes, and chewing tobacco. If you need help quitting, ask your health care provider. Do not use street drugs. Do not share needles. Ask your health care provider for help if you need support or information about quitting drugs. Alcohol use Do not drink alcohol if your health care provider tells you not to drink. If you drink alcohol: Limit how much you have to 0-2 drinks a day. Be aware of how much alcohol is in your drink. In the [place] one drink equals one 12 oz bottle of beer (355 mL), one 5 oz glass of wine (148 mL), or one 1 oz glass of hard liquor (44 mL). General instructions Schedule regular health, dental, and eye exams. Stay current with your vaccines. Tell your health care provider if: You often feel depressed. You have ever been abused or do not feel safe at home. Summary Adopting a healthy lifestyle and getting preventive care are important in promoting health and wellness. Follow your health care provider's instructions about healthy diet, exercising, and getting tested or screened for diseases. Follow your health care provider's instructions on monitoring your cholesterol and blood pressure. This information is not intended to replace advice given to you by your health care provider. Make sure you discuss any questions you have with your health care provider. Document Revised: 09/02/2020 Document Reviewed: 06/18/2018 Elsevier Patient Education  2022 Elsevier Inc.  

## 2021-04-20 ENCOUNTER — Other Ambulatory Visit: Payer: Self-pay

## 2021-05-30 ENCOUNTER — Telehealth: Payer: Self-pay | Admitting: Pulmonary Disease

## 2021-05-30 NOTE — Telephone Encounter (Signed)
Received fax from [facility] for short term disability. Patient was last seen on 06/15/2020 for a HST and last seen on 05/09/2020 by Dr. Wynona Neat. Left voicemail for patient to call back to schedule an appointment before we are able to fill out forms.

## 2021-05-31 ENCOUNTER — Other Ambulatory Visit: Payer: Self-pay

## 2021-05-31 ENCOUNTER — Encounter: Payer: Self-pay | Admitting: Nurse Practitioner

## 2021-05-31 ENCOUNTER — Ambulatory Visit (INDEPENDENT_AMBULATORY_CARE_PROVIDER_SITE_OTHER): Payer: 59 | Admitting: Nurse Practitioner

## 2021-05-31 ENCOUNTER — Telehealth: Payer: Self-pay | Admitting: *Deleted

## 2021-05-31 DIAGNOSIS — G4733 Obstructive sleep apnea (adult) (pediatric): Secondary | ICD-10-CM | POA: Insufficient documentation

## 2021-05-31 NOTE — Assessment & Plan Note (Signed)
Pt reports compliance with CPAP therapy; however, his length of sleep at night varies depending on when he gets home from work. He is transitioning jobs soon and agrees to wear his CPAP throughout the night.   Patient Instructions  Increase utilization of your CPAP to a minimum of 4-6 hours a night. Maintain a consistent bedtime, when able. Avoid electronics at night and in the bedroom.  Maintain clean equipment, as directed by home health agency.  Be aware of reduced alertness and do not drive or operate heavy machinery if experiencing this or drowsiness.  Exercise encouraged, as tolerated. Healthy weight management discussed.  Avoid or decrease alcohol consumption and medications that make you more sleepy, if possible. Notify if persistent daytime sleepiness occurs even with consistent use of CPAP.   We discussed how untreated sleep apnea puts an individual at risk for cardiac arrhthymias, pulm HTN, DM, stroke and increases their risk for daytime accidents.   Follow up in 3 months with Dr. Wynona Neat or APP. If symptoms do not improve or worsen, please contact office for sooner follow up or seek emergency care.

## 2021-05-31 NOTE — Progress Notes (Signed)
@Patient  ID: Adrian Thomas, male    DOB: 07-08-1991, 30 y.o.   MRN: [id number]  Chief Complaint  Patient presents with   Follow-up    Referring provider: Vevelyn Francois, NP  HPI: 30 year old male, never smoker followed for severe obstructive sleep apnea. He is a patient of Dr. Judson Roch and last seen in office November 2021. Past medical history significant for pneumonia related to COVID 19 with long haulers symptoms of dyspnea which he has been seeing his PCP for.   TEST/EVENTS:  02/27/2020 CXR 2 View: Shallow lung inflation with bibasilar opacities.  No pleural effusion or pneumothorax.  Normal cardiomediastinal contours. 03/01/2020 CTA chest: No focal filling defect identified in the pulmonary arteries to suggest acute pulmonary embolism.  No lymphadenopathy.  Relatively low lung volumes.  Diffuse bilateral lower lobe pulmonary groundglass opacity, progressing to consolidation in both lower lobes.  Early peribronchial consolidation also in the middle lobes.  There is bilateral more peripheral and left central groundglass in the upper lobes.  Negative for acute PE.  Bilateral COVID-19 pneumonia with severe lower lobe involvement.  No pleural effusion 03/13/2020 CXR 2 view: Persistent bibasilar airspace opacities, not significantly changed on the right, perhaps slightly improved aeration at the left lung base.  No pleural effusion or pneumothorax is seen.  Heart size and mediastinal contours are stable.  Persistent bibasilar pneumonia, perhaps slightly improved on the left compared to the previous checks x-ray of 02/27/2020 05/13/2020 CXR 2 view: Few streaky opacities present in the right lower lung could reflect some minimal residual scarring.  No residual consolidative opacities.  No pneumothorax or effusion.  06/15/2020 HST: AHI 50.6, SPO2 below 40% with an average of 85%.  Severe obstructive sleep apnea.  Auto CPAP 5-20 ordered.   05/09/2020: OV with Dr. Ander Slade. Workup for OSA. Snoring apneas  and weight gain of 40-50 lbs reported. Frequent night awakenings. Reported previous hospitalization for COVID pneumonia in August 2021. [facility] 14. HST encouraged. Healthy weight management discussed. F/u 3 months.   04/17/2021: OV with PCP. Noted to have persistent SOB with work related activity and environmental changes. Reported he had received his CPAP machine. He is not on any maintenance or PRN medications.   05/31/2021: Today - f/u Patient presents today for follow up of severe sleep apnea. Unable to obtain CPAP download today. Patient states he received his CPAP a few months ago. He reports he wears it the entire time he sleeps; however, with his current job, his sleep time varies and can be anywhere from 3 hours to 8 hours. He tries to go to bed when he gets home from work which can be between 8pm and midnight. 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 He reports improved daytime fatigue symptoms and denies narcolepsy or drowsy driving. His shortness of breath has also improved. He reports he rarely experiences this except for when he is in confined spaces with a lot of equipment on at work. He denies wheezing, orthopnea, PND, or leg swelling. Overall, he feels well and has no further complaints.    No Known Allergies  Immunization History  Administered Date(s) Administered   Tdap 01/16/2015    No past medical history on file.  Tobacco History: Social History   Tobacco Use  Smoking Status Never  Smokeless Tobacco Never   Counseling given: Not Answered   No outpatient medications prior to visit.   No facility-administered medications prior to visit.  Review of Systems:   Constitutional: No weight loss or gain, night sweats, fevers, chills, +fatigue (significantly improved from last visit)  HEENT: No headaches, difficulty swallowing, tooth/dental problems, or sore throat. No sneezing, itching, ear ache,  nasal congestion, or post nasal drip CV:  No chest pain, orthopnea, PND, swelling in lower extremities, anasarca, dizziness, palpitations, syncope Resp:  +shortness of breath with exertion (rarely; significant improvement; only occurs with strenuous activity. No excess mucus or change in color of mucus. No productive or non-productive. No hemoptysis. No wheezing.  No chest wall deformity GI:  No heartburn, indigestion, abdominal pain, nausea, vomiting, diarrhea, change in bowel habits, loss of appetite, bloody stools.  GU: No dysuria, change in color of urine, urgency or frequency.  No flank pain, no hematuria  Skin: No rash, lesions, ulcerations MSK:  No joint pain or swelling.  No decreased range of motion.  No back pain. Neuro: No dizziness or lightheadedness.  Psych: No depression or anxiety. Mood stable.     Physical Exam:  BP 124/80 (BP Location: Right Arm, Patient Position: Sitting, Cuff Size: Normal)   Pulse 87   Temp 98.3 F (36.8 C) (Oral)   Ht 5\' 11"  (1.803 m)   Wt 247 lb (112 kg)   SpO2 99%   BMI 34.45 kg/m   GEN: Pleasant, interactive, obese; in no acute distress. HEENT:  Normocephalic and atraumatic. EACs patent bilaterally. TM pearly gray with present light reflex bilaterally. PERRLA. Sclera white. Nasal turbinates pink, moist and patent bilaterally. No rhinorrhea present. Oropharynx pink and moist, without exudate or edema. No lesions, ulcerations, or postnasal drip.  NECK:  Supple w/ fair ROM. No JVD present. Normal carotid impulses w/o bruits. Thyroid symmetrical with no goiter or nodules palpated. No lymphadenopathy.   CV: RRR, no m/r/g, no peripheral edema. Pulses intact, +2 bilaterally. No cyanosis, pallor or clubbing. PULMONARY:  Unlabored, regular breathing. Clear bilaterally A&P w/o wheezes/rales/rhonchi. No accessory muscle use. No dullness to percussion. GI: BS present and normoactive. Soft, non-tender to palpation. No organomegaly or masses detected. No CVA  tenderness. MSK: No erythema, warmth or tenderness. Cap refil <2 sec all extrem. No deformities or joint swelling noted.  Neuro: A/Ox3. No focal deficits noted.   Skin: Warm, no lesions or rashe Psych: Normal affect and behavior. Judgement and thought content appropriate.     Lab Results:  CBC    Component Value Date/Time   WBC 10.2 03/13/2020 1806   RBC 4.98 03/13/2020 1806   HGB 13.9 03/13/2020 1806   HCT 43.5 03/13/2020 1806   PLT 206 03/13/2020 1806   MCV 87.3 03/13/2020 1806   MCH 27.9 03/13/2020 1806   MCHC 32.0 03/13/2020 1806   RDW 14.4 03/13/2020 1806   LYMPHSABS 2.0 03/13/2020 1806   MONOABS 0.7 03/13/2020 1806   EOSABS 0.1 03/13/2020 1806   BASOSABS 0.0 03/13/2020 1806    BMET    Component Value Date/Time   NA 137 03/13/2020 1806   K 4.1 03/13/2020 1806   CL 99 03/13/2020 1806   CO2 28 03/13/2020 1806   GLUCOSE 94 03/13/2020 1806   BUN 10 03/13/2020 1806   CREATININE 0.80 03/13/2020 1806   CALCIUM 9.3 03/13/2020 1806   GFRNONAA >60 03/13/2020 1806   GFRAA >60 03/13/2020 1806    BNP No results found for: BNP   Imaging:  No results found.    No flowsheet data found.  No results found for: NITRICOXIDE      Assessment & Plan:   Severe  obstructive sleep apnea-hypopnea syndrome Pt reports compliance with CPAP therapy; however, his length of sleep at night varies depending on when he gets home from work. He is transitioning jobs soon and agrees to wear his CPAP throughout the night.   Patient Instructions  Increase utilization of your CPAP to a minimum of 4-6 hours a night. Maintain a consistent bedtime, when able. Avoid electronics at night and in the bedroom.  Maintain clean equipment, as directed by home health agency.  Be aware of reduced alertness and do not drive or operate heavy machinery if experiencing this or drowsiness.  Exercise encouraged, as tolerated. Healthy weight management discussed.  Avoid or decrease alcohol consumption  and medications that make you more sleepy, if possible. Notify if persistent daytime sleepiness occurs even with consistent use of CPAP.   We discussed how untreated sleep apnea puts an individual at risk for cardiac arrhthymias, pulm HTN, DM, stroke and increases their risk for daytime accidents.   Follow up in 3 months with Dr. Ander Slade or APP. If symptoms do not improve or worsen, please contact office for sooner follow up or seek emergency care.      Clayton Bibles, NP 05/31/2021  Pt aware and understands NP's role.

## 2021-05-31 NOTE — Patient Instructions (Signed)
Increase utilization of your CPAP to a minimum of 4-6 hours a night. Maintain a consistent bedtime, when able. Avoid electronics at night and in the bedroom.  Maintain clean equipment, as directed by home health agency.  Be aware of reduced alertness and do not drive or operate heavy machinery if experiencing this or drowsiness.  Exercise encouraged, as tolerated. Healthy weight management discussed.  Avoid or decrease alcohol consumption and medications that make you more sleepy, if possible. Notify if persistent daytime sleepiness occurs even with consistent use of CPAP.   We discussed how untreated sleep apnea puts an individual at risk for cardiac arrhthymias, pulm HTN, DM, stroke and increases their risk for daytime accidents.   Follow up in 3 months with Dr. Wynona Neat or APP. If symptoms do not improve or worsen, please contact office for sooner follow up or seek emergency care.

## 2021-05-31 NOTE — Telephone Encounter (Signed)
Called and spoke with a representative of [employer] regarding CPAP compliance as on [employer] the data is >32 year old.  Advised that he was called on 05/08/21 and advised regarding the noncompliance and he was out of the window with insurance.  He is no longer being monitored remotely and has an SD card.  Patient presented to the office for a visit, he did not have his SD card with him.  Called and spoke with Aurther Loft with [employer] and requested a download for the past 30 days be faxed to [phone].  Will await download.

## 2021-06-06 NOTE — Telephone Encounter (Signed)
Left voicemail for patient to call back to determine the need for the paperwork. The form says short term disability starting 02/19/2020. If this is related to diagnosis of covid pneumonia back in August the form needs to go to his PCP. If related to his OSA we can complete but is he wanting/needing to be taken out of work? What type of restrictions is needed as per the form - because his OSA restrictions will only include not driving sleepy, and ensure regular shifts to allow cpap usages of 4-6 hours per night. Anything related to shortness of breath would be related to his covid long haul and that is followed by his PCP.

## 2021-08-23 ENCOUNTER — Ambulatory Visit: Payer: 59 | Admitting: Pulmonary Disease

## 2021-10-19 ENCOUNTER — Ambulatory Visit: Payer: Self-pay | Admitting: Nurse Practitioner

## 2021-11-08 NOTE — Telephone Encounter (Signed)
Shredding paperwork as patient has not returned any calls or mychart messages.  ?

## 2021-12-19 ENCOUNTER — Ambulatory Visit (INDEPENDENT_AMBULATORY_CARE_PROVIDER_SITE_OTHER): Payer: 59

## 2021-12-19 ENCOUNTER — Encounter (HOSPITAL_COMMUNITY): Payer: Self-pay

## 2021-12-19 ENCOUNTER — Ambulatory Visit (HOSPITAL_COMMUNITY)
Admission: EM | Admit: 2021-12-19 | Discharge: 2021-12-19 | Disposition: A | Discharge status: Home / Self Care | Payer: 59 | Attending: Family Medicine | Admitting: Family Medicine

## 2021-12-19 DIAGNOSIS — R0789 Other chest pain: Secondary | ICD-10-CM

## 2021-12-19 DIAGNOSIS — R0600 Dyspnea, unspecified: Secondary | ICD-10-CM

## 2021-12-19 DIAGNOSIS — R0602 Shortness of breath: Secondary | ICD-10-CM | POA: Diagnosis not present

## 2021-12-19 DIAGNOSIS — R062 Wheezing: Secondary | ICD-10-CM

## 2021-12-19 MED ORDER — PREDNISONE 20 MG PO TABS: 40.0000 mg | ORAL_TABLET | Freq: Every day | ORAL | 0 refills | Status: DC

## 2021-12-19 NOTE — ED Provider Notes (Signed)
[facility]   [id number] 12/19/21 Arrival Time: 0930  ASSESSMENT & PLAN:  1. SOB (shortness of breath)   2. Chest tightness   3. Wheezing     ECG: Performed today and interpreted by me: normal EKG, sinus brady; no STEMI.  I have personally viewed the imaging studies ordered this visit. Normal CXR. No pneumothorax.  Begin trial of: Meds ordered this encounter  Medications   predniSONE (DELTASONE) 20 MG tablet    Sig: Take 2 tablets (40 mg total) by mouth daily.    Dispense:  10 tablet    Refill:  0     Discharge Instructions      You have been seen at the [facility] today for chest pain. Your evaluation today was not suggestive of any emergent condition requiring medical intervention at this time. Your chest x-ray and ECG (heart tracing) did not show any worrisome changes. However, some medical problems make take more time to appear. Therefore, it's very important that you pay attention to any new symptoms or worsening of your current condition.  Please proceed directly to the Emergency Department immediately should you feel worse in any way or have any of the following symptoms: increasing or different chest pain, pain that spreads to your arm, neck, jaw, back or abdomen, shortness of breath, or nausea and vomiting.      Reviewed expectations re: course of current medical issues. Questions answered. Outlined signs and symptoms indicating need for more acute intervention. Patient verbalized understanding. After Visit Summary given.   SUBJECTIVE:  History from: patient. Adrian Thomas is a 31 y.o. male who presents with complaint of SOB and "chest tightness". First noted last evening. Does wear CPAP for sleep apnea. Ques relation. Unsure if wheezing at times. Non-smoker. Afebrile. Today noted mild SOB with exertion; no worsening of chest tightness with exertion. Fatigued today. Questions COVID exposure 4 d ago. No URI symptoms. No abd or back pain.  Ambulatory here without difficulty. Denies: claudication, irregular heart beat, lower extremity edema, near-syncope, orthopnea, palpitations, paroxysmal nocturnal dyspnea, syncope, and tachypnea. Aggravating factors: have not been identified. Alleviating factors: have not been identified. Denies recreational drug use.  Social History   Tobacco Use  Smoking Status Never  Smokeless Tobacco Never   Social History   Substance and Sexual Activity  Alcohol Use No    OBJECTIVE:  Vitals:   12/19/21 1030  BP: (!) 119/99  Pulse: 76  Resp: 18  Temp: 98.3 F (36.8 C)  TempSrc: Oral  SpO2: 97%    General appearance: alert, oriented, no acute distress Eyes: PERRLA; EOMI; conjunctivae normal HENT: normocephalic; atraumatic Neck: supple with FROM Lungs: without labored respirations; speaks full sentences without difficulty; does have brief bilateral exp wheezing initially on exam; does clear with deep breaths Heart: regular rate and rhythm Chest Wall: without tenderness to palpation Abdomen: soft, non-tender; no guarding or rebound tenderness Extremities: without edema; without calf swelling or tenderness; symmetrical without gross deformities Skin: warm and dry; without rash or lesions Neuro: normal gait Psychological: alert and cooperative; normal mood and affect   Imaging: DG Chest 2 View  Result Date: 12/19/2021 CLINICAL DATA:  Dyspnea EXAM: CHEST - 2 VIEW COMPARISON:  05/13/2020 chest radiograph. FINDINGS: Stable cardiomediastinal silhouette with normal heart size. No pneumothorax. No pleural effusion. Lungs appear clear, with no acute consolidative airspace disease and no pulmonary edema. IMPRESSION: No active cardiopulmonary disease. Electronically Signed   By: Delbert Phenix M.D.   On: 12/19/2021 11:08  No Known Allergies  History reviewed. No pertinent past medical history. Social History   Socioeconomic History   Marital status: Single    Spouse name: Not on file    Number of children: Not on file   Years of education: Not on file   Highest education level: Not on file  Occupational History   Not on file  Tobacco Use   Smoking status: Never   Smokeless tobacco: Never  Vaping Use   Vaping Use: Never used  Substance and Sexual Activity   Alcohol use: No   Drug use: No   Sexual activity: Yes    Birth control/protection: Condom  Other Topics Concern   Not on file  Social History Narrative   Not on file   Social Determinants of Health   Financial Resource Strain: Not on file  Food Insecurity: Not on file  Transportation Needs: Not on file  Physical Activity: Not on file  Stress: Not on file  Social Connections: Not on file  Intimate Partner Violence: Not on file   Family History  Problem Relation Age of Onset   Diabetes Mellitus II Neg Hx    History reviewed. No pertinent surgical history.    Mardella Layman, MD 12/19/21 1347

## 2021-12-19 NOTE — Discharge Instructions (Signed)
You have been seen at the [facility] today for chest pain. Your evaluation today was not suggestive of any emergent condition requiring medical intervention at this time. Your chest x-ray and ECG (heart tracing) did not show any worrisome changes. However, some medical problems make take more time to appear. Therefore, it's very important that you pay attention to any new symptoms or worsening of your current condition.  Please proceed directly to the Emergency Department immediately should you feel worse in any way or have any of the following symptoms: increasing or different chest pain, pain that spreads to your arm, neck, jaw, back or abdomen, shortness of breath, or nausea and vomiting.  

## 2021-12-19 NOTE — ED Triage Notes (Signed)
Pt states uses a CPAP at night and last night he started having SOB while on CPAP and feels SOB and tired today. States had respiratory issues when he had COVID. States had a positive COVID exposure 4 days ago. No distress noted, speaking in complete sentences.

## 2023-01-17 IMAGING — DX DG CHEST 2V
2 series · 2 of 2 positions shown · non-contrast
Comparison: 05/13/2020 chest radiograph.

CLINICAL DATA: Dyspnea

EXAM:
CHEST - 2 VIEW

[chest pa]
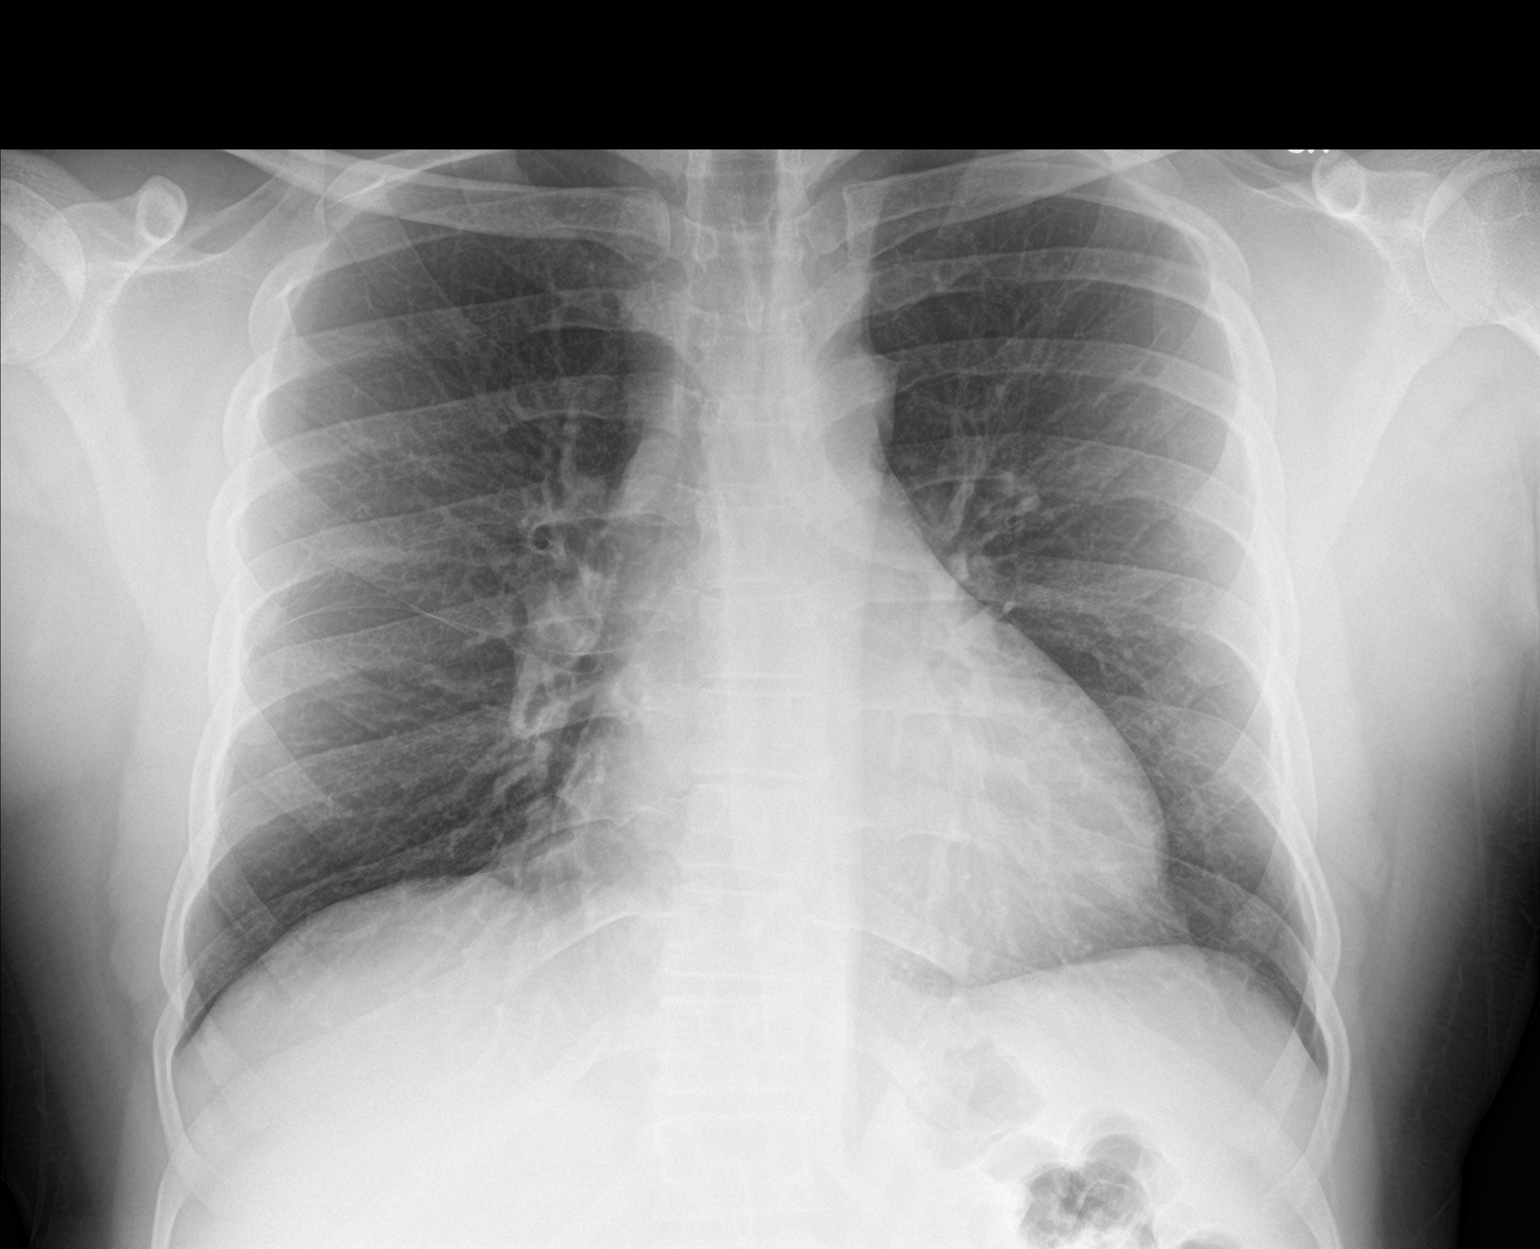

[chest lat]
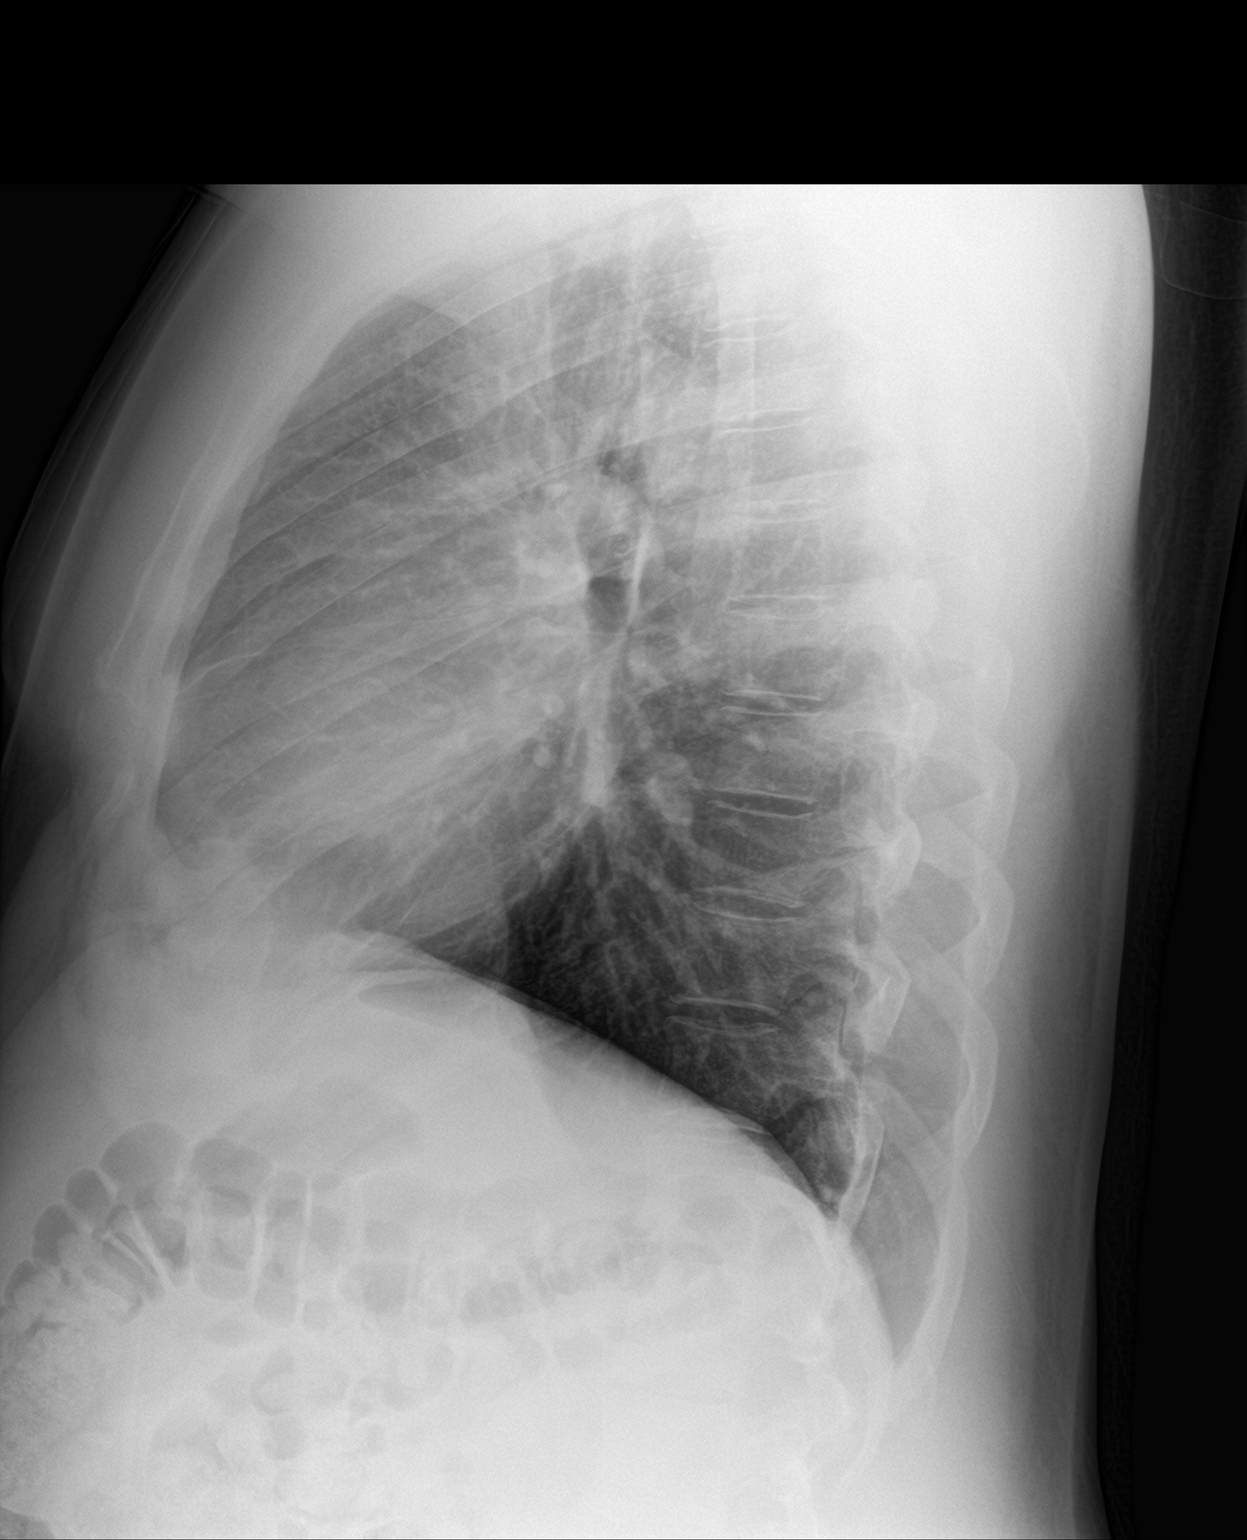

[2 of 2 positions shown; findings below may reference images not displayed]

FINDINGS: Stable cardiomediastinal silhouette with normal heart size. No
pneumothorax. No pleural effusion. Lungs appear clear, with no acute
consolidative airspace disease and no pulmonary edema.
IMPRESSION: No active cardiopulmonary disease.

## 2023-07-31 ENCOUNTER — Ambulatory Visit (INDEPENDENT_AMBULATORY_CARE_PROVIDER_SITE_OTHER): Payer: BLUE CROSS/BLUE SHIELD | Admitting: Nurse Practitioner

## 2023-07-31 ENCOUNTER — Encounter: Payer: Self-pay | Admitting: Nurse Practitioner

## 2023-07-31 VITALS — BP 128/75 | HR 75 | Temp 97.3°F | Ht 71.0 in | Wt 249.0 lb

## 2023-07-31 DIAGNOSIS — Z0000 Encounter for general adult medical examination without abnormal findings: Secondary | ICD-10-CM | POA: Diagnosis not present

## 2023-07-31 DIAGNOSIS — G4733 Obstructive sleep apnea (adult) (pediatric): Secondary | ICD-10-CM | POA: Diagnosis not present

## 2023-07-31 DIAGNOSIS — Z1321 Encounter for screening for nutritional disorder: Secondary | ICD-10-CM

## 2023-07-31 DIAGNOSIS — Z13228 Encounter for screening for other metabolic disorders: Secondary | ICD-10-CM

## 2023-07-31 DIAGNOSIS — Z1329 Encounter for screening for other suspected endocrine disorder: Secondary | ICD-10-CM | POA: Diagnosis not present

## 2023-07-31 DIAGNOSIS — R7303 Prediabetes: Secondary | ICD-10-CM | POA: Insufficient documentation

## 2023-07-31 DIAGNOSIS — E669 Obesity, unspecified: Secondary | ICD-10-CM | POA: Insufficient documentation

## 2023-07-31 DIAGNOSIS — Z130 Encounter for screening for diseases of the blood and blood-forming organs and certain disorders involving the immune mechanism: Secondary | ICD-10-CM

## 2023-07-31 NOTE — Assessment & Plan Note (Signed)
Wt Readings from Last 3 Encounters:  07/31/23 249 lb (112.9 kg)  05/31/21 247 lb (112 kg)  04/17/21 242 lb (109.8 kg)   Body mass index is 34.73 kg/m.  Patient counseled on low-carb diet Encouraged to engage in regular moderate to vigorous exercise at least 150 minutes weekly Benefits of healthy weights discussed

## 2023-07-31 NOTE — Progress Notes (Addendum)
 Complete physical exam  Patient: Adrian Thomas   DOB: Aug 03, 1990   33 y.o. Male  MRN: [id number]  Subjective:    Chief Complaint  Patient presents with   Establish Care    Adrian Thomas is a 33 y.o. male  has a past medical history of Sleep apnea. who presents today for a complete physical exam. He reports consuming a general diet. The patient does not participate in regular exercise at present. He generally feels fairly well. He reports sleeping poorly. Stated that his CPAP machine is broken and needs a new machine.     Most recent fall risk assessment:    04/17/2021    3:32 PM  Fall Risk   Falls in the past year? 0  Number falls in past yr: 0  Injury with Fall? 0     Most recent depression screenings:    08/23/2023    3:21 PM 07/31/2023    1:45 PM  PHQ 2/9 Scores  PHQ - 2 Score 0 0        Patient Care Team: Hartman Minahan R, FNP as PCP - General (Nurse Practitioner)   Outpatient Medications Prior to Visit  Medication Sig   [DISCONTINUED] predniSONE  (DELTASONE ) 20 MG tablet Take 2 tablets (40 mg total) by mouth daily.   No facility-administered medications prior to visit.    Review of Systems  Constitutional:  Negative for appetite change, chills, fatigue and fever.  HENT:  Negative for congestion, postnasal drip, rhinorrhea and sneezing.   Eyes:  Negative for pain, discharge and itching.  Respiratory:  Negative for cough, shortness of breath and wheezing.   Cardiovascular:  Negative for chest pain, palpitations and leg swelling.  Gastrointestinal:  Negative for abdominal pain, constipation, nausea and vomiting.  Endocrine: Negative for cold intolerance, heat intolerance and polydipsia.  Genitourinary:  Negative for difficulty urinating, dysuria, flank pain and frequency.  Musculoskeletal:  Negative for arthralgias, back pain, joint swelling and myalgias.  Skin:  Negative for color change, pallor, rash and wound.  Allergic/Immunologic: Negative for food  allergies and immunocompromised state.  Neurological:  Negative for dizziness, facial asymmetry, weakness, numbness and headaches.  Psychiatric/Behavioral:  Positive for sleep disturbance. Negative for behavioral problems, confusion, self-injury and suicidal ideas.        Objective:     BP 128/75   Pulse 75   Temp (!) 97.3 F (36.3 C)   Ht 5' 11 (1.803 m)   Wt 249 lb (112.9 kg)   SpO2 98%   BMI 34.73 kg/m    Physical Exam Vitals and nursing note reviewed.  Constitutional:      General: He is not in acute distress.    Appearance: Normal appearance. He is obese. He is not ill-appearing, toxic-appearing or diaphoretic.  HENT:     Right Ear: Tympanic membrane, ear canal and external ear normal. There is no impacted cerumen.     Left Ear: Tympanic membrane, ear canal and external ear normal. There is no impacted cerumen.     Nose: Nose normal. No congestion or rhinorrhea.     Mouth/Throat:     Mouth: Mucous membranes are moist.     Pharynx: Oropharynx is clear. No oropharyngeal exudate or posterior oropharyngeal erythema.   Eyes:     General: No scleral icterus.       Right eye: No discharge.        Left eye: No discharge.     Extraocular Movements: Extraocular movements intact.     Conjunctiva/sclera: Conjunctivae  normal.   Neck:     Vascular: No carotid bruit.   Cardiovascular:     Rate and Rhythm: Normal rate and regular rhythm.     Pulses: Normal pulses.     Heart sounds: Normal heart sounds. No murmur heard.    No friction rub. No gallop.  Pulmonary:     Effort: Pulmonary effort is normal. No respiratory distress.     Breath sounds: Normal breath sounds. No stridor. No wheezing, rhonchi or rales.  Chest:     Chest wall: No tenderness.  Abdominal:     General: Bowel sounds are normal. There is no distension.     Palpations: Abdomen is soft. There is no mass.     Tenderness: There is no abdominal tenderness. There is no right CVA tenderness, left CVA tenderness,  guarding or rebound.     Hernia: No hernia is present.   Musculoskeletal:        General: No swelling, tenderness, deformity or signs of injury.     Cervical back: Normal range of motion and neck supple. No rigidity or tenderness.     Right lower leg: No edema.     Left lower leg: No edema.  Lymphadenopathy:     Cervical: No cervical adenopathy.   Skin:    General: Skin is warm and dry.     Capillary Refill: Capillary refill takes less than 2 seconds.     Coloration: Skin is not jaundiced or pale.     Findings: No bruising, erythema, lesion or rash.   Neurological:     Mental Status: He is alert and oriented to person, place, and time.     Cranial Nerves: No cranial nerve deficit.     Sensory: No sensory deficit.     Motor: No weakness.     Coordination: Coordination normal.     Gait: Gait normal.     Deep Tendon Reflexes: Reflexes normal.   Psychiatric:        Mood and Affect: Mood normal.        Behavior: Behavior normal.        Thought Content: Thought content normal.        Judgment: Judgment normal.     Results for orders placed or performed in visit on 07/31/23  CBC  Result Value Ref Range   WBC 5.6 3.4 - 10.8 x10E3/uL   RBC 5.04 4.14 - 5.80 x10E6/uL   Hemoglobin 14.3 13.0 - 17.7 g/dL   Hematocrit 54.9 62.4 - 51.0 %   MCV 89 79 - 97 fL   MCH 28.4 26.6 - 33.0 pg   MCHC 31.8 31.5 - 35.7 g/dL   RDW 86.3 88.3 - 84.5 %   Platelets 232 150 - 450 x10E3/uL  CMP14+EGFR  Result Value Ref Range   Glucose 90 70 - 99 mg/dL   BUN 12 6 - 20 mg/dL   Creatinine, Ser 8.91 0.76 - 1.27 mg/dL   eGFR 92 >40 fO/fpw/8.26   BUN/Creatinine Ratio 11 9 - 20   Sodium 141 134 - 144 mmol/L   Potassium 4.5 3.5 - 5.2 mmol/L   Chloride 104 96 - 106 mmol/L   CO2 24 20 - 29 mmol/L   Calcium 9.4 8.7 - 10.2 mg/dL   Total Protein 7.6 6.0 - 8.5 g/dL   Albumin 4.5 4.1 - 5.1 g/dL   Globulin, Total 3.1 1.5 - 4.5 g/dL   Bilirubin Total 0.5 0.0 - 1.2 mg/dL   Alkaline Phosphatase 67 44 - 121  IU/L   AST 19 0 - 40 IU/L   ALT 28 0 - 44 IU/L  Hemoglobin A1c  Result Value Ref Range   Hgb A1c MFr Bld 6.1 (H) 4.8 - 5.6 %   Est. average glucose Bld gHb Est-mCnc 128 mg/dL  Lipid panel  Result Value Ref Range   Cholesterol, Total 157 100 - 199 mg/dL   Triglycerides 56 0 - 149 mg/dL   HDL 31 (L) >60 mg/dL   VLDL Cholesterol Cal 11 5 - 40 mg/dL   LDL Chol Calc (NIH) 884 (H) 0 - 99 mg/dL   Chol/HDL Ratio 5.1 (H) 0.0 - 5.0 ratio  Hepatitis C antibody  Result Value Ref Range   Hep C Virus Ab Non Reactive Non Reactive       Assessment & Plan:    Routine Health Maintenance and Physical Exam  Immunization History  Administered Date(s) Administered   Tdap 01/16/2015    Health Maintenance  Topic Date Due   Hepatitis B Vaccines (1 of 3 - 19+ 3-dose series) Never done   HPV VACCINES (1 - 3-dose SCDM series) Never done   COVID-19 Vaccine (1 - 2024-25 season) Never done   INFLUENZA VACCINE  02/07/2024   DTaP/Tdap/Td (2 - Td or Tdap) 01/15/2025   Hepatitis C Screening  Completed   HIV Screening  Completed   Meningococcal B Vaccine  Aged Out    Discussed health benefits of physical activity, and encouraged him to engage in regular exercise appropriate for his age and condition.  Problem List Items Addressed This Visit       Respiratory   Severe obstructive sleep apnea-hypopnea syndrome   Patient uses and benefits from CPAP Needs a new CPAP machine Will [facility] regarding this        Other   Obesity (BMI 30-39.9)   Wt Readings from Last 3 Encounters:  07/31/23 249 lb (112.9 kg)  05/31/21 247 lb (112 kg)  04/17/21 242 lb (109.8 kg)   Body mass index is 34.73 kg/m.  Patient counseled on low-carb diet Encouraged to engage in regular moderate to vigorous exercise at least 150 minutes weekly Benefits of healthy weights discussed      Annual physical exam - Primary   Annual exam as documented.  Counseling done include healthy lifestyle involving committing to 150  minutes of exercise per week, heart healthy diet, and attaining healthy weight. The importance of adequate sleep also discussed.  Regular use of seat belt and home safety were also discussed . Changes in health habits are decided on by patient with goals and time frames set for achieving them. Immunization and cancer screening  needs are specifically addressed at this visit.    Flu vaccine declined patient encouraged to consider getting the flu vaccine  - CBC - CMP14+EGFR - Hemoglobin A1c - Lipid panel - Hepatitis C antibody       Prediabetes   Lab Results  Component Value Date   HGBA1C 5.7 (A) 05/13/2020   HGBA1C 5.7 05/13/2020   HGBA1C 5.7 05/13/2020   HGBA1C 5.7 05/13/2020  Patient counseled on low-carb diet Encouraged to lose weight      Relevant Orders   Hemoglobin A1c (Completed)   Other Visit Diagnoses       Screening for endocrine, nutritional, metabolic and immunity disorder       Relevant Orders   CBC (Completed)   CMP14+EGFR (Completed)   Hemoglobin A1c (Completed)   Lipid panel (Completed)   Hepatitis C antibody (Completed)  Return in about 1 year (around 07/30/2024) for CPE.     Keyonte Cookston R Emersyn Wyss, FNP

## 2023-07-31 NOTE — Assessment & Plan Note (Addendum)
 Patient uses and benefits from CPAP Needs a new CPAP machine [facility] regarding this

## 2023-07-31 NOTE — Assessment & Plan Note (Signed)
Lab Results  Component Value Date   HGBA1C 5.7 (A) 05/13/2020   HGBA1C 5.7 05/13/2020   HGBA1C 5.7 05/13/2020   HGBA1C 5.7 05/13/2020  Patient counseled on low-carb diet Encouraged to lose weight

## 2023-07-31 NOTE — Patient Instructions (Signed)

## 2023-07-31 NOTE — Assessment & Plan Note (Signed)
Annual exam as documented.  Counseling done include healthy lifestyle involving committing to 150 minutes of exercise per week, heart healthy diet, and attaining healthy weight. The importance of adequate sleep also discussed.  Regular use of seat belt and home safety were also discussed . Changes in health habits are decided on by patient with goals and time frames set for achieving them. Immunization and cancer screening  needs are specifically addressed at this visit.    Flu vaccine declined patient encouraged to consider getting the flu vaccine  - CBC - CMP14+EGFR - Hemoglobin A1c - Lipid panel - Hepatitis C antibody

## 2023-08-01 LAB — CMP14+EGFR
ALT: 28 [IU]/L (ref 0–44)
AST: 19 [IU]/L (ref 0–40)
Albumin: 4.5 g/dL (ref 4.1–5.1)
Alkaline Phosphatase: 67 [IU]/L (ref 44–121)
BUN/Creatinine Ratio: 11 (ref 9–20)
BUN: 12 mg/dL (ref 6–20)
Bilirubin Total: 0.5 mg/dL (ref 0.0–1.2)
CO2: 24 mmol/L (ref 20–29)
Calcium: 9.4 mg/dL (ref 8.7–10.2)
Chloride: 104 mmol/L (ref 96–106)
Creatinine, Ser: 1.08 mg/dL (ref 0.76–1.27)
Globulin, Total: 3.1 g/dL (ref 1.5–4.5)
Glucose: 90 mg/dL (ref 70–99)
Potassium: 4.5 mmol/L (ref 3.5–5.2)
Sodium: 141 mmol/L (ref 134–144)
Total Protein: 7.6 g/dL (ref 6.0–8.5)
eGFR: 92 mL/min/{1.73_m2} (ref >59)

## 2023-08-01 LAB — CBC
Hematocrit: 45 % (ref 37.5–51.0)
Hemoglobin: 14.3 g/dL (ref 13.0–17.7)
MCH: 28.4 pg (ref 26.6–33.0)
MCHC: 31.8 g/dL (ref 31.5–35.7)
MCV: 89 fL (ref 79–97)
Platelets: 232 10*3/uL (ref 150–450)
RBC: 5.04 x10E6/uL (ref 4.14–5.80)
RDW: 13.6 % (ref 11.6–15.4)
WBC: 5.6 10*3/uL (ref 3.4–10.8)

## 2023-08-01 LAB — LIPID PANEL
Chol/HDL Ratio: 5.1 {ratio} — ABNORMAL HIGH (ref 0.0–5.0)
Cholesterol, Total: 157 mg/dL (ref 100–199)
HDL: 31 mg/dL — ABNORMAL LOW (ref >39)
LDL Chol Calc (NIH): 115 mg/dL — ABNORMAL HIGH (ref 0–99)
Triglycerides: 56 mg/dL (ref 0–149)
VLDL Cholesterol Cal: 11 mg/dL (ref 5–40)

## 2023-08-01 LAB — HEPATITIS C ANTIBODY: Hep C Virus Ab: NONREACTIVE

## 2023-08-01 LAB — HEMOGLOBIN A1C
Est. average glucose Bld gHb Est-mCnc: 128 mg/dL
Hgb A1c MFr Bld: 6.1 % — ABNORMAL HIGH (ref 4.8–5.6)

## 2023-08-09 ENCOUNTER — Telehealth: Payer: Self-pay | Admitting: Nurse Practitioner

## 2023-08-09 NOTE — Telephone Encounter (Signed)
Copied from CRM [id number]. Topic: General - Other >> Aug 08, 2023  5:09 PM Shelah Lewandowsky wrote: Reason for CRM: returning call from office, no message was left

## 2023-08-13 ENCOUNTER — Telehealth: Payer: Self-pay

## 2023-08-13 NOTE — Telephone Encounter (Signed)
Patient was advised of results.  

## 2023-08-23 ENCOUNTER — Ambulatory Visit (INDEPENDENT_AMBULATORY_CARE_PROVIDER_SITE_OTHER): Payer: BLUE CROSS/BLUE SHIELD | Admitting: Nurse Practitioner

## 2023-08-23 ENCOUNTER — Encounter: Payer: Self-pay | Admitting: Nurse Practitioner

## 2023-08-23 VITALS — BP 122/68 | HR 78 | Temp 97.9°F | Wt 246.0 lb

## 2023-08-23 DIAGNOSIS — B351 Tinea unguium: Secondary | ICD-10-CM | POA: Diagnosis not present

## 2023-08-23 MED ORDER — CICLOPIROX 8 % EX SOLN: Freq: Every day | CUTANEOUS | 2 refills | Status: AC

## 2023-08-23 NOTE — Assessment & Plan Note (Addendum)
1. Onychomycosis (Primary)  - ciclopirox (PENLAC) 8 % solution; Apply topically at bedtime. Apply over nail and surrounding skin. Apply daily over previous coat. After seven (7) days, may remove with alcohol and continue cycle.  Dispense: 6.6 mL; Refill: 2  States that he is able to trim his nails by himself I discussed with the patient that it may take up to 6 months or longer for this to resolve Educational material on onychomycosis provided

## 2023-08-23 NOTE — Patient Instructions (Signed)
1. Onychomycosis (Primary)  - ciclopirox (PENLAC) 8 % solution; Apply topically at bedtime. Apply over nail and surrounding skin. Apply daily over previous coat. After seven (7) days, may remove with alcohol and continue cycle.  Dispense: 6.6 mL; Refill: 2    It is important that you exercise regularly at least 30 minutes 5 times a week as tolerated  Think about what you will eat, plan ahead. Choose " clean, green, fresh or frozen" over canned, processed or packaged foods which are more sugary, salty and fatty. 70 to 75% of food eaten should be vegetables and fruit. Three meals at set times with snacks allowed between meals, but they must be fruit or vegetables. Aim to eat over a 12 hour period , example 7 am to 7 pm, and STOP after  your last meal of the day. Drink water,generally about 64 ounces per day, no other drink is as healthy. Fruit juice is best enjoyed in a healthy way, by EATING the fruit.  Thanks for choosing [facility] we consider it a privelige to serve you.

## 2023-08-23 NOTE — Progress Notes (Signed)
Acute Office Visit  Subjective:     Patient ID: Jakaiden Fill, male    DOB: March 03, 1991, 33 y.o.   MRN: [id number]  Chief Complaint  Patient presents with   Nail Problem    Dryness and nail color    HPI Patient is in today for complaints of  toe nail discoloration, thickened nail  for years, he denies toe pain, redness.  Has never used any medication for his toe nail discoloration  Review of Systems  Constitutional:  Negative for appetite change, chills, fatigue and fever.  HENT:  Negative for congestion, postnasal drip, rhinorrhea and sneezing.   Respiratory:  Negative for cough, shortness of breath and wheezing.   Cardiovascular:  Negative for chest pain, palpitations and leg swelling.  Gastrointestinal:  Negative for abdominal pain, constipation, nausea and vomiting.  Genitourinary:  Negative for difficulty urinating, dysuria, flank pain and frequency.  Musculoskeletal:  Negative for arthralgias, back pain, joint swelling and myalgias.  Skin:  Positive for color change. Negative for pallor, rash and wound.  Neurological:  Negative for dizziness, facial asymmetry, weakness, numbness and headaches.  Psychiatric/Behavioral:  Negative for behavioral problems, confusion, self-injury and suicidal ideas.         Objective:    BP 122/68   Pulse 78   Temp 97.9 F (36.6 C)   Wt 246 lb (111.6 kg)   SpO2 98%   BMI 34.31 kg/m    Physical Exam Vitals and nursing note reviewed.  Constitutional:      General: He is not in acute distress.    Appearance: Normal appearance. He is obese. He is not ill-appearing, toxic-appearing or diaphoretic.  HENT:     Mouth/Throat:     Mouth: Mucous membranes are moist.     Pharynx: Oropharynx is clear. No oropharyngeal exudate or posterior oropharyngeal erythema.  Eyes:     General: No scleral icterus.       Right eye: No discharge.        Left eye: No discharge.     Extraocular Movements: Extraocular movements intact.      Conjunctiva/sclera: Conjunctivae normal.  Cardiovascular:     Rate and Rhythm: Normal rate and regular rhythm.     Pulses: Normal pulses.     Heart sounds: Normal heart sounds. No murmur heard.    No friction rub. No gallop.  Pulmonary:     Effort: Pulmonary effort is normal. No respiratory distress.     Breath sounds: Normal breath sounds. No stridor. No wheezing, rhonchi or rales.  Chest:     Chest wall: No tenderness.  Abdominal:     General: There is no distension.     Palpations: Abdomen is soft.     Tenderness: There is no abdominal tenderness. There is no right CVA tenderness, left CVA tenderness or guarding.  Musculoskeletal:        General: No swelling, tenderness, deformity or signs of injury.     Right lower leg: No edema.     Left lower leg: No edema.  Skin:    General: Skin is warm and dry.     Coloration: Skin is not jaundiced or pale.     Findings: No bruising, erythema or lesion.     Comments: Many of the Toenails bilaterally appears thickened and discolored  Neurological:     Mental Status: He is alert and oriented to person, place, and time.     Motor: No weakness.     Coordination: Coordination normal.  Gait: Gait normal.  Psychiatric:        Mood and Affect: Mood normal.        Behavior: Behavior normal.        Thought Content: Thought content normal.        Judgment: Judgment normal.     No results found for any visits on 08/23/23.      Assessment & Plan:   Problem List Items Addressed This Visit       Musculoskeletal and Integument   Onychomycosis - Primary   1. Onychomycosis (Primary)  - ciclopirox (PENLAC) 8 % solution; Apply topically at bedtime. Apply over nail and surrounding skin. Apply daily over previous coat. After seven (7) days, may remove with alcohol and continue cycle.  Dispense: 6.6 mL; Refill: 2  States that he is able to trim his nails by himself I discussed with the patient that it may take up to 6 months or longer for  this to resolve Educational material on onychomycosis provided      Relevant Medications   ciclopirox (PENLAC) 8 % solution    Meds ordered this encounter  Medications   ciclopirox (PENLAC) 8 % solution    Sig: Apply topically at bedtime. Apply over nail and surrounding skin. Apply daily over previous coat. After seven (7) days, may remove with alcohol and continue cycle.    Dispense:  6.6 mL    Refill:  2    Return in about 6 months (around 02/20/2024) for onychomycosis.  Donell Beers, FNP

## 2023-12-12 ENCOUNTER — Other Ambulatory Visit: Payer: Self-pay

## 2023-12-12 DIAGNOSIS — G4733 Obstructive sleep apnea (adult) (pediatric): Secondary | ICD-10-CM

## 2023-12-12 NOTE — Telephone Encounter (Signed)
 Placed order and sent to [facility]

## 2023-12-31 ENCOUNTER — Telehealth: Payer: Self-pay

## 2023-12-31 NOTE — Telephone Encounter (Signed)
 Copied from CRM [id number]. Topic: Referral - Question >> Dec 30, 2023  4:52 PM Delon HERO wrote: Reason for CRM: Hidi calling from [facility] is calling because received a referral for the patient requesting update office visit notes from 07/31/2023 needing provider to add on to the office visit notes that the patient is benefiting from the current CPAP machine that he has. And resent these CB- [phone] Fax- [phone]

## 2024-01-02 NOTE — Telephone Encounter (Signed)
 Sent Adine New  a message asking if this is something he could help with. [facility]

## 2024-02-21 ENCOUNTER — Ambulatory Visit: Payer: Self-pay | Admitting: Nurse Practitioner

## 2024-03-02 ENCOUNTER — Ambulatory Visit: Payer: Self-pay | Admitting: Nurse Practitioner

## 2024-03-11 ENCOUNTER — Encounter: Payer: Self-pay | Admitting: Nurse Practitioner

## 2024-03-11 ENCOUNTER — Other Ambulatory Visit: Payer: Self-pay

## 2024-03-11 ENCOUNTER — Ambulatory Visit (INDEPENDENT_AMBULATORY_CARE_PROVIDER_SITE_OTHER): Payer: Self-pay | Admitting: Nurse Practitioner

## 2024-03-11 VITALS — BP 145/98 | HR 93 | Ht 71.0 in | Wt 248.0 lb

## 2024-03-11 DIAGNOSIS — G4730 Sleep apnea, unspecified: Secondary | ICD-10-CM

## 2024-03-11 DIAGNOSIS — G4733 Obstructive sleep apnea (adult) (pediatric): Secondary | ICD-10-CM
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
## 2024-03-11 NOTE — Patient Instructions (Addendum)
 Sleep Apnea:  Referral has been placed to pulmonary  Obesity. - discussed how weight can impact sleep and risk for sleep disordered breathing - discussed options to assist with weight loss: combination of diet modification, cardiovascular and strength training exercises   Cardiovascular risk. - had an extensive discussion regarding the adverse health consequences related to untreated sleep disordered breathing - specifically discussed the risks for hypertension, coronary artery disease, cardiac dysrhythmias, cerebrovascular disease, and diabetes - lifestyle modification discussed   Safe driving practices. - discussed how sleep disruption can increase risk of accidents, particularly when driving - safe driving practices were discussed -do not drive if drowsy

## 2024-04-10 ENCOUNTER — Ambulatory Visit: Payer: Self-pay | Admitting: Nurse Practitioner

## 2024-04-28 ENCOUNTER — Ambulatory Visit: Admitting: Pulmonary Disease

## 2024-04-28 DIAGNOSIS — G4733 Obstructive sleep apnea (adult) (pediatric): Secondary | ICD-10-CM

## 2024-05-08 ENCOUNTER — Ambulatory Visit: Payer: Self-pay | Admitting: Nurse Practitioner

## 2024-06-18 ENCOUNTER — Ambulatory Visit: Admitting: Pulmonary Disease

## 2024-06-22 ENCOUNTER — Encounter (INDEPENDENT_AMBULATORY_CARE_PROVIDER_SITE_OTHER): Payer: Self-pay

## 2024-06-29 ENCOUNTER — Ambulatory Visit: Admitting: Nurse Practitioner

## 2024-07-24 ENCOUNTER — Encounter: Payer: Self-pay | Admitting: Pulmonary Disease

## 2024-07-27 ENCOUNTER — Encounter: Payer: Self-pay | Admitting: Pulmonary Disease

## 2024-07-27 ENCOUNTER — Ambulatory Visit: Admitting: Pulmonary Disease

## 2024-07-27 VITALS — BP 106/62 | HR 74 | Temp 97.5°F | Ht 71.0 in | Wt 256.0 lb

## 2024-07-27 DIAGNOSIS — E669 Obesity, unspecified: Secondary | ICD-10-CM | POA: Diagnosis not present

## 2024-07-27 DIAGNOSIS — G4733 Obstructive sleep apnea (adult) (pediatric): Secondary | ICD-10-CM | POA: Diagnosis not present

## 2024-07-27 NOTE — Progress Notes (Signed)
 "              Adrian Thomas    [id number]    1990/09/07  Primary Care Physician:Paseda, Thomes SAUNDERS, FNP  Referring Physician: Oley Bascom RAMAN, NP 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 Apply over nail and surrounding skin. Apply daily over previous coat. After seven (7) days, may remove with alcohol and continue cycle. (Patient not taking: Reported on 07/27/2024)   No facility-administered encounter medications on file as of 07/27/2024.    Allergies as of 07/27/2024   (No Known Allergies)    Past Medical History:  Diagnosis Date   Sleep apnea     History reviewed. No pertinent surgical history.  Family History  Problem Relation Age of Onset   Diabetes Mellitus II Neg Hx     Social History   Socioeconomic History   Marital status: Single    Spouse name: Not on file   Number of children: 1   Years of education: Not on file   Highest education level: Not on file  Occupational History   Not on file  Tobacco Use   Smoking status: Never   Smokeless tobacco: Never  Vaping Use   Vaping status: Never Used  Substance and Sexual Activity   Alcohol use: No   Drug use: No   Sexual activity: Yes    Birth control/protection: Condom   Other Topics Concern   Not on file  Social History Narrative   Lives with his room mate    Social Drivers of Health   Tobacco Use: Low Risk (07/27/2024)   Patient History    Smoking Tobacco Use: Never    Smokeless Tobacco Use: Never    Passive Exposure: Not on file  Financial Resource Strain: Not on file  Food Insecurity: Not on file  Transportation Needs: Not on file  Physical Activity: Not on file  Stress: Not on file  Social Connections: Not on file  Intimate Partner Violence: Not on file  Depression (PHQ2-9): Low Risk (08/23/2023)   Depression (PHQ2-9)    PHQ-2 Score: 0  Alcohol Screen: Not on file  Housing: Not on file  Utilities: Not on file  Health Literacy: Not on file    Review of Systems  Respiratory:  Positive for apnea.   Psychiatric/Behavioral:  Positive for sleep disturbance.     Vitals:   07/27/24 0929  BP: 106/62  Pulse: 74  Temp: (!) 97.5 F (36.4 C)  SpO2: 93%     Physical Exam Constitutional:      Appearance: He is obese.  HENT:     Head: Normocephalic.     Nose: Nose normal.  Mouth/Throat:     Mouth: Mucous membranes are moist.  Eyes:     General: No scleral icterus. Cardiovascular:     Rate and Rhythm: Normal rate and regular rhythm.     Heart sounds: No murmur heard.    No friction rub.  Pulmonary:     Effort: No respiratory distress.     Breath sounds: No stridor. No wheezing or rhonchi.  Musculoskeletal:        General: No swelling. Normal range of motion.     Cervical back: No rigidity or tenderness.  Skin:    General: Skin is warm.     Coloration: Skin is not jaundiced.  Neurological:     General: No focal deficit present.     Mental Status: He is alert.     Cranial Nerves: No cranial nerve deficit.  Psychiatric:        Mood and Affect: Mood normal.   Epworth Sleepiness Scale of 2   Data Reviewed: Compliance data reviewed shows excellent compliance with CPAP residual AHI of 6.7  Assessment/Plan: Patient with  severe obstructive sleep apnea Compliant with CPAP Uses CPAP nightly Denies significant daytime sleepiness - Continues to benefit from CPAP use  He drives for living Letter provided today for compliance and his fitness to drive - He reports no ongoing concerns for sleepiness and remains compliant with the machine  Obesity - Encouraged to continue weight loss efforts  Follow-up in about 6 months    Jennet Epley MD [facility] 07/27/2024, 5:05 PM  CC: Oley Bascom RAMAN, NP   "

## 2024-07-27 NOTE — Patient Instructions (Signed)
 History of severe obstructive sleep apnea -Compliant with CPAP use -No daytime sleepiness -Number of events controlled with residual AHI of 6.7  Continue using CPAP on a nightly basis  Follow-up in about 6 months  Call us  with significant concerns

## 2024-07-28 ENCOUNTER — Ambulatory Visit (INDEPENDENT_AMBULATORY_CARE_PROVIDER_SITE_OTHER): Admitting: Nurse Practitioner

## 2024-07-28 ENCOUNTER — Encounter: Payer: Self-pay | Admitting: Nurse Practitioner

## 2024-07-28 ENCOUNTER — Other Ambulatory Visit: Payer: Self-pay

## 2024-07-28 VITALS — BP 129/70 | HR 74 | Wt 256.0 lb

## 2024-07-28 DIAGNOSIS — E669 Obesity, unspecified: Secondary | ICD-10-CM | POA: Diagnosis not present

## 2024-07-28 DIAGNOSIS — Z1321 Encounter for screening for nutritional disorder: Secondary | ICD-10-CM | POA: Diagnosis not present

## 2024-07-28 DIAGNOSIS — G4733 Obstructive sleep apnea (adult) (pediatric): Secondary | ICD-10-CM

## 2024-07-28 DIAGNOSIS — E782 Mixed hyperlipidemia: Secondary | ICD-10-CM

## 2024-07-28 DIAGNOSIS — Z1329 Encounter for screening for other suspected endocrine disorder: Secondary | ICD-10-CM | POA: Diagnosis not present

## 2024-07-28 DIAGNOSIS — R7303 Prediabetes: Secondary | ICD-10-CM

## 2024-07-28 DIAGNOSIS — Z0000 Encounter for general adult medical examination without abnormal findings: Secondary | ICD-10-CM | POA: Diagnosis not present

## 2024-07-28 DIAGNOSIS — Z130 Encounter for screening for diseases of the blood and blood-forming organs and certain disorders involving the immune mechanism: Secondary | ICD-10-CM | POA: Diagnosis not present

## 2024-07-28 DIAGNOSIS — Z13228 Encounter for screening for other metabolic disorders: Secondary | ICD-10-CM

## 2024-07-28 MED ORDER — ZEPBOUND 2.5 MG/0.5ML ~~LOC~~ SOAJ: 2.5000 mg | SUBCUTANEOUS | 0 refills | Status: AC

## 2024-07-28 NOTE — Assessment & Plan Note (Addendum)
 Wt Readings from Last 3 Encounters:  07/28/24 256 lb (116.1 kg)  07/27/24 256 lb (116.1 kg)  03/11/24 248 lb (112.5 kg)   Body mass index is 35.7 kg/m.   BMI of 35. Discussed weight loss benefits, including potential reduction in CPAP therapy need. He showed interest in weight loss medication. - Prescribed zepbound , starting at lowest dose, increase dose every 4 weeks as tolerated Patient denies personal or family history of MTC or MEN 2.  They denied personal history of pancreatitis.  Patient encouraged to avoid fatty fried foods eat smaller portions of meal to help decrease nausea.  Encouraged to report abdominal pain, nausea, vomiting.   Encouraged 30 minutes of moderate to vigorous exercise, five days a week. - Recommended low-carb diet: 70-80% vegetables and protein, reduced carbohydrates. - Advised on portion control and water instead of soda.

## 2024-07-28 NOTE — Assessment & Plan Note (Signed)
" °  Severe obstructive sleep apnea, diagnosed in 2021. Using CPAP with improved symptoms. No recent sleep study. - Continue CPAP therapy. - Monitor for daytime sleepiness, report if symptoms recur. - Zepbound  ordered ,consider updated sleep study if required by insurance for medication coverage of zepbound .  [name] "

## 2024-07-28 NOTE — Assessment & Plan Note (Signed)
 Annual exam as documented.  Counseling done include healthy lifestyle involving committing to 150 minutes of exercise per week, heart healthy diet, and attaining healthy weight. The importance of adequate sleep also discussed.  Regular use of seat belt  also discussed . Changes in health habits are decided on by patient with goals and time frames set for achieving them. Immunization and cancer screening  needs are specifically addressed at this visit.    Declined flu vaccine

## 2024-07-28 NOTE — Assessment & Plan Note (Signed)
 Lab Results  Component Value Date   HGBA1C 6.1 (H) 07/31/2023   Previous HbA1c of 6.1% twelve months ago. Discussed weight loss and dietary changes to prevent diabetes progression. - Ordered blood work: HbA1c,  - Counseled on low-carb diet

## 2024-07-28 NOTE — Patient Instructions (Signed)
 1. Obesity (BMI 30-39.9) (Primary) - tirzepatide  (ZEPBOUND ) 2.5 MG/0.5ML Pen; Inject 2.5 mg into the skin once a week.  Dispense: 2 mL; Refill: 0  2. Severe obstructive sleep apnea-hypopnea syndrome - tirzepatide  (ZEPBOUND ) 2.5 MG/0.5ML Pen; Inject 2.5 mg into the skin once a week.  Dispense: 2 mL; Refill: 0  3. Annual physical exam  4. Prediabetes - Hemoglobin A1c  5. Screening for endocrine, nutritional, metabolic and immunity disorder - CBC - CMP14+EGFR  6. Mixed hyperlipidemia - Lipid panel    It is important that you exercise regularly at least 30 minutes 5 times a week as tolerated  Think about what you will eat, plan ahead. Choose  clean, green, fresh or frozen over canned, processed or packaged foods which are more sugary, salty and fatty. 70 to 75% of food eaten should be vegetables and fruit. Three meals at set times with snacks allowed between meals, but they must be fruit or vegetables. Aim to eat over a 12 hour period , example 7 am to 7 pm, and STOP after  your last meal of the day. Drink water,generally about 64 ounces per day, no other drink is as healthy. Fruit juice is best enjoyed in a healthy way, by EATING the fruit.  Thanks for choosing [facility] we consider it a privelige to serve you.
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
## 2024-07-29 ENCOUNTER — Ambulatory Visit: Payer: Self-pay | Admitting: Nurse Practitioner

## 2024-07-29 LAB — CMP14+EGFR
ALT: 34 IU/L (ref 0–44)
AST: 21 IU/L (ref 0–40)
Albumin: 4.4 g/dL (ref 4.1–5.1)
Alkaline Phosphatase: 56 IU/L (ref 47–123)
BUN/Creatinine Ratio: 9 (ref 9–20)
BUN: 9 mg/dL (ref 6–20)
Bilirubin Total: 0.5 mg/dL (ref 0.0–1.2)
CO2: 20 mmol/L (ref 20–29)
Calcium: 9.3 mg/dL (ref 8.7–10.2)
Chloride: 105 mmol/L (ref 96–106)
Creatinine, Ser: 1.04 mg/dL (ref 0.76–1.27)
Globulin, Total: 2.9 g/dL (ref 1.5–4.5)
Glucose: 95 mg/dL (ref 70–99)
Potassium: 4.3 mmol/L (ref 3.5–5.2)
Sodium: 140 mmol/L (ref 134–144)
Total Protein: 7.3 g/dL (ref 6.0–8.5)
eGFR: 96 mL/min/1.73

## 2024-07-29 LAB — HEMOGLOBIN A1C
Est. average glucose Bld gHb Est-mCnc: 120 mg/dL
Hgb A1c MFr Bld: 5.8 % — ABNORMAL HIGH (ref 4.8–5.6)

## 2024-07-29 LAB — CBC
Hematocrit: 44.2 % (ref 37.5–51.0)
Hemoglobin: 14.3 g/dL (ref 13.0–17.7)
MCH: 28.2 pg (ref 26.6–33.0)
MCHC: 32.4 g/dL (ref 31.5–35.7)
MCV: 87 fL (ref 79–97)
Platelets: 246 x10E3/uL (ref 150–450)
RBC: 5.07 x10E6/uL (ref 4.14–5.80)
RDW: 13.5 % (ref 11.6–15.4)
WBC: 5.8 x10E3/uL (ref 3.4–10.8)

## 2024-07-29 LAB — LIPID PANEL
Chol/HDL Ratio: 5.6 ratio — ABNORMAL HIGH (ref 0.0–5.0)
Cholesterol, Total: 157 mg/dL (ref 100–199)
HDL: 28 mg/dL — ABNORMAL LOW
LDL Chol Calc (NIH): 116 mg/dL — ABNORMAL HIGH (ref 0–99)
Triglycerides: 67 mg/dL (ref 0–149)
VLDL Cholesterol Cal: 13 mg/dL (ref 5–40)

## 2024-08-05 NOTE — Telephone Encounter (Signed)
 Patient was seen in the office on 1/19. Nothing further needed.

## 2024-09-08 ENCOUNTER — Ambulatory Visit: Payer: Self-pay | Admitting: Nurse Practitioner

## 2024-11-09 ENCOUNTER — Encounter: Payer: Self-pay | Admitting: Nurse Practitioner
